# Patient Record
Sex: Female | Born: 1965 | Race: White | Hispanic: No | Marital: Married | Smoking: Never smoker
Health system: Southern US, Community
[De-identification: ages and names within clinical notes are randomized; demographics above are authoritative.]

## PROBLEM LIST (undated history)

## (undated) DIAGNOSIS — E079 Disorder of thyroid, unspecified: Secondary | ICD-10-CM

## (undated) DIAGNOSIS — R569 Unspecified convulsions: Secondary | ICD-10-CM

## (undated) DIAGNOSIS — E785 Hyperlipidemia, unspecified: Secondary | ICD-10-CM

## (undated) DIAGNOSIS — J342 Deviated nasal septum: Secondary | ICD-10-CM

## (undated) DIAGNOSIS — D568 Other thalassemias: Secondary | ICD-10-CM

## (undated) DIAGNOSIS — K219 Gastro-esophageal reflux disease without esophagitis: Secondary | ICD-10-CM

## (undated) DIAGNOSIS — R112 Nausea with vomiting, unspecified: Secondary | ICD-10-CM

## (undated) DIAGNOSIS — T7840XA Allergy, unspecified, initial encounter: Secondary | ICD-10-CM

## (undated) DIAGNOSIS — F419 Anxiety disorder, unspecified: Secondary | ICD-10-CM

## (undated) DIAGNOSIS — Z9889 Other specified postprocedural states: Secondary | ICD-10-CM

## (undated) HISTORY — DX: Allergy, unspecified, initial encounter: T78.40XA

## (undated) HISTORY — DX: Disorder of thyroid, unspecified: E07.9

## (undated) HISTORY — DX: Deviated nasal septum: J34.2

## (undated) HISTORY — DX: Other specified postprocedural states: Z98.890

## (undated) HISTORY — DX: Hyperlipidemia, unspecified: E78.5

## (undated) HISTORY — DX: Gastro-esophageal reflux disease without esophagitis: K21.9

## (undated) HISTORY — DX: Nausea with vomiting, unspecified: R11.2

## (undated) HISTORY — PX: ABDOMINAL HYSTERECTOMY: SHX81

## (undated) HISTORY — DX: Unspecified convulsions: R56.9

## (undated) HISTORY — DX: Other thalassemias: D56.8

## (undated) HISTORY — DX: Anxiety disorder, unspecified: F41.9

## (undated) HISTORY — PX: EXPLORATORY LAPAROTOMY: SUR591

## (undated) HISTORY — PX: SEPTOPLASTY: SUR1290

---

## 1998-11-12 HISTORY — PX: EXPLORATORY LAPAROTOMY: SUR591

## 1999-12-29 ENCOUNTER — Other Ambulatory Visit: Admission: RE | Admit: 1999-12-29 | Discharge: 1999-12-29 | Payer: Self-pay | Admitting: Gynecology

## 2000-04-12 ENCOUNTER — Encounter (INDEPENDENT_AMBULATORY_CARE_PROVIDER_SITE_OTHER): Payer: Self-pay

## 2000-04-12 ENCOUNTER — Other Ambulatory Visit: Admission: RE | Admit: 2000-04-12 | Discharge: 2000-04-12 | Payer: Self-pay | Admitting: Gynecology

## 2000-12-27 ENCOUNTER — Encounter (INDEPENDENT_AMBULATORY_CARE_PROVIDER_SITE_OTHER): Payer: Self-pay | Admitting: Specialist

## 2000-12-27 ENCOUNTER — Other Ambulatory Visit: Admission: RE | Admit: 2000-12-27 | Discharge: 2000-12-27 | Payer: Self-pay | Admitting: Gynecology

## 2001-06-09 ENCOUNTER — Other Ambulatory Visit: Admission: RE | Admit: 2001-06-09 | Discharge: 2001-06-09 | Payer: Self-pay | Admitting: Gynecology

## 2002-01-11 ENCOUNTER — Inpatient Hospital Stay (HOSPITAL_COMMUNITY): Admission: AD | Admit: 2002-01-11 | Discharge: 2002-01-15 | Payer: Self-pay | Admitting: Obstetrics and Gynecology

## 2002-01-11 ENCOUNTER — Encounter (INDEPENDENT_AMBULATORY_CARE_PROVIDER_SITE_OTHER): Payer: Self-pay | Admitting: Specialist

## 2002-02-23 ENCOUNTER — Other Ambulatory Visit: Admission: RE | Admit: 2002-02-23 | Discharge: 2002-02-23 | Payer: Self-pay | Admitting: Obstetrics and Gynecology

## 2003-03-29 ENCOUNTER — Other Ambulatory Visit: Admission: RE | Admit: 2003-03-29 | Discharge: 2003-03-29 | Payer: Self-pay | Admitting: Obstetrics and Gynecology

## 2004-03-29 ENCOUNTER — Other Ambulatory Visit: Admission: RE | Admit: 2004-03-29 | Discharge: 2004-03-29 | Payer: Self-pay | Admitting: Obstetrics and Gynecology

## 2004-10-09 ENCOUNTER — Ambulatory Visit: Payer: Self-pay | Admitting: Internal Medicine

## 2005-04-03 ENCOUNTER — Other Ambulatory Visit: Admission: RE | Admit: 2005-04-03 | Discharge: 2005-04-03 | Payer: Self-pay | Admitting: Obstetrics and Gynecology

## 2005-05-16 ENCOUNTER — Ambulatory Visit: Payer: Self-pay | Admitting: Internal Medicine

## 2005-07-13 ENCOUNTER — Ambulatory Visit: Payer: Self-pay | Admitting: Internal Medicine

## 2005-07-19 ENCOUNTER — Other Ambulatory Visit: Admission: RE | Admit: 2005-07-19 | Discharge: 2005-07-19 | Payer: Self-pay | Admitting: Obstetrics and Gynecology

## 2005-10-15 ENCOUNTER — Emergency Department (HOSPITAL_COMMUNITY): Admission: EM | Admit: 2005-10-15 | Discharge: 2005-10-15 | Payer: Self-pay | Admitting: Emergency Medicine

## 2005-11-14 ENCOUNTER — Ambulatory Visit: Payer: Self-pay | Admitting: Internal Medicine
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
## 2006-05-13 ENCOUNTER — Ambulatory Visit: Payer: Self-pay | Admitting: Internal Medicine

## 2006-06-17 ENCOUNTER — Ambulatory Visit: Payer: Self-pay | Admitting: Internal Medicine

## 2006-12-03 ENCOUNTER — Ambulatory Visit: Payer: Self-pay | Admitting: Internal Medicine

## 2007-01-14 ENCOUNTER — Ambulatory Visit: Payer: Self-pay | Admitting: Internal Medicine

## 2007-04-04 DIAGNOSIS — J309 Allergic rhinitis, unspecified: Secondary | ICD-10-CM | POA: Insufficient documentation

## 2007-04-04 DIAGNOSIS — E785 Hyperlipidemia, unspecified: Secondary | ICD-10-CM

## 2007-06-08 DIAGNOSIS — L732 Hidradenitis suppurativa: Secondary | ICD-10-CM | POA: Insufficient documentation

## 2007-06-09 ENCOUNTER — Telehealth (INDEPENDENT_AMBULATORY_CARE_PROVIDER_SITE_OTHER): Payer: Self-pay | Admitting: *Deleted

## 2007-06-09 ENCOUNTER — Ambulatory Visit: Payer: Self-pay | Admitting: Internal Medicine

## 2007-08-19 ENCOUNTER — Ambulatory Visit: Payer: Self-pay | Admitting: Internal Medicine

## 2008-03-29 ENCOUNTER — Ambulatory Visit: Payer: Self-pay | Admitting: Internal Medicine

## 2008-03-29 ENCOUNTER — Encounter: Payer: Self-pay | Admitting: Internal Medicine

## 2008-03-31 ENCOUNTER — Telehealth: Payer: Self-pay | Admitting: Internal Medicine

## 2008-04-02 ENCOUNTER — Telehealth (INDEPENDENT_AMBULATORY_CARE_PROVIDER_SITE_OTHER): Payer: Self-pay | Admitting: *Deleted

## 2008-08-03 ENCOUNTER — Ambulatory Visit: Payer: Self-pay | Admitting: Internal Medicine

## 2008-08-10 ENCOUNTER — Ambulatory Visit: Payer: Self-pay | Admitting: Internal Medicine

## 2008-08-10 LAB — CONVERTED CEMR LAB
Albumin: 4.2 g/dL (ref 3.5–5.2)
Alkaline Phosphatase: 50 units/L (ref 39–117)
BUN: 13 mg/dL (ref 6–23)
Bilirubin Urine: NEGATIVE
Calcium: 9.4 mg/dL (ref 8.4–10.5)
Cholesterol: 218 mg/dL (ref 0–200)
Creatinine, Ser: 0.8 mg/dL (ref 0.4–1.2)
Direct LDL: 155.8 mg/dL
Eosinophils Absolute: 0.1 10*3/uL (ref 0.0–0.7)
Eosinophils Relative: 1.7 % (ref 0.0–5.0)
GFR calc Af Amer: 102 mL/min
GFR calc non Af Amer: 84 mL/min
Glucose, Bld: 82 mg/dL (ref 70–99)
HCT: 32.8 % — ABNORMAL LOW (ref 36.0–46.0)
Hemoglobin: 10.7 g/dL — ABNORMAL LOW (ref 12.0–15.0)
Ketones, urine, test strip: NEGATIVE
MCV: 58.8 fL — ABNORMAL LOW (ref 78.0–100.0)
Monocytes Absolute: 0.3 10*3/uL (ref 0.1–1.0)
Neutro Abs: 2.3 10*3/uL (ref 1.4–7.7)
Platelets: 348 10*3/uL (ref 150–400)
Potassium: 5 meq/L (ref 3.5–5.1)
RDW: 16.1 % — ABNORMAL HIGH (ref 11.5–14.6)
Specific Gravity, Urine: 1.02
TSH: 3.09 microintl units/mL (ref 0.35–5.50)
Total Protein: 6.6 g/dL (ref 6.0–8.3)
Triglycerides: 79 mg/dL (ref 0–149)
WBC: 3.8 10*3/uL — ABNORMAL LOW (ref 4.5–10.5)

## 2008-08-15 ENCOUNTER — Encounter: Payer: Self-pay | Admitting: Internal Medicine

## 2008-08-17 ENCOUNTER — Ambulatory Visit: Payer: Self-pay | Admitting: Internal Medicine

## 2008-08-17 ENCOUNTER — Encounter: Payer: Self-pay | Admitting: Internal Medicine

## 2008-08-17 DIAGNOSIS — D568 Other thalassemias: Secondary | ICD-10-CM | POA: Insufficient documentation

## 2008-08-17 HISTORY — DX: Other thalassemias: D56.8

## 2008-08-17 LAB — CONVERTED CEMR LAB
Iron: 97 ug/dL (ref 42–145)
Transferrin: 200.4 mg/dL — ABNORMAL LOW (ref >=212.0)

## 2008-09-09 ENCOUNTER — Ambulatory Visit: Payer: Self-pay | Admitting: Internal Medicine

## 2008-09-09 ENCOUNTER — Encounter: Payer: Self-pay | Admitting: Internal Medicine

## 2008-11-12 HISTORY — PX: ABDOMINAL HYSTERECTOMY: SHX81

## 2008-12-23 ENCOUNTER — Ambulatory Visit: Payer: Self-pay | Admitting: Internal Medicine

## 2008-12-23 LAB — CONVERTED CEMR LAB
ALT: 13 units/L (ref 0–35)
Alkaline Phosphatase: 51 units/L (ref 39–117)
Bilirubin, Direct: 0.1 mg/dL (ref 0.0–0.3)
Cholesterol: 234 mg/dL (ref 0–200)
Eosinophils Relative: 1.5 % (ref 0.0–5.0)
HCT: 35.3 % — ABNORMAL LOW (ref 36.0–46.0)
HDL: 67.3 mg/dL (ref >39.0)
Monocytes Absolute: 0.3 10*3/uL (ref 0.1–1.0)
Monocytes Relative: 6.6 % (ref 3.0–12.0)
Neutrophils Relative %: 64.8 % (ref 43.0–77.0)
Platelets: 337 10*3/uL (ref 150–400)
RDW: 16.6 % — ABNORMAL HIGH (ref 11.5–14.6)
Total Bilirubin: 0.9 mg/dL (ref 0.3–1.2)
Total CHOL/HDL Ratio: 3.5
Total Protein: 6.8 g/dL (ref 6.0–8.3)
Triglycerides: 103 mg/dL (ref 0–149)
VLDL: 21 mg/dL (ref 0–40)
WBC: 4.5 10*3/uL (ref 4.5–10.5)

## 2009-01-04 ENCOUNTER — Ambulatory Visit: Payer: Self-pay | Admitting: Internal Medicine

## 2009-01-04 DIAGNOSIS — B070 Plantar wart: Secondary | ICD-10-CM

## 2009-01-04 DIAGNOSIS — J40 Bronchitis, not specified as acute or chronic: Secondary | ICD-10-CM

## 2009-01-04 LAB — CONVERTED CEMR LAB
Cholesterol, target level: 200 mg/dL
LDL Goal: 160 mg/dL

## 2009-01-07 ENCOUNTER — Ambulatory Visit: Payer: Self-pay | Admitting: Internal Medicine

## 2009-04-28 ENCOUNTER — Ambulatory Visit: Payer: Self-pay | Admitting: Internal Medicine

## 2009-04-28 DIAGNOSIS — T887XXA Unspecified adverse effect of drug or medicament, initial encounter: Secondary | ICD-10-CM | POA: Insufficient documentation

## 2009-04-28 LAB — CONVERTED CEMR LAB
ALT: 12 units/L (ref 0–35)
Alkaline Phosphatase: 43 units/L (ref 39–117)
Bilirubin, Direct: 0 mg/dL (ref 0.0–0.3)
Direct LDL: 139.4 mg/dL
HDL: 71.4 mg/dL (ref >39.00)
Total Bilirubin: 0.9 mg/dL (ref 0.3–1.2)
Total CHOL/HDL Ratio: 3
Total Protein: 7.1 g/dL (ref 6.0–8.3)
Triglycerides: 91 mg/dL (ref 0.0–149.0)

## 2009-05-05 ENCOUNTER — Ambulatory Visit: Payer: Self-pay | Admitting: Internal Medicine

## 2009-05-05 DIAGNOSIS — L910 Hypertrophic scar: Secondary | ICD-10-CM

## 2009-06-11 LAB — HM MAMMOGRAPHY

## 2009-08-25 ENCOUNTER — Ambulatory Visit: Payer: Self-pay | Admitting: Internal Medicine

## 2009-12-14 ENCOUNTER — Ambulatory Visit: Payer: Self-pay | Admitting: Internal Medicine

## 2009-12-14 LAB — CONVERTED CEMR LAB
AST: 22 units/L (ref 0–37)
Albumin: 4.3 g/dL (ref 3.5–5.2)
BUN: 11 mg/dL (ref 6–23)
Basophils Absolute: 0 10*3/uL (ref 0.0–0.1)
CO2: 29 meq/L (ref 19–32)
Chloride: 108 meq/L (ref 96–112)
Direct LDL: 143.6 mg/dL
GFR calc non Af Amer: 83.15 mL/min (ref >60)
Glucose, Bld: 86 mg/dL (ref 70–99)
Glucose, Urine, Semiquant: NEGATIVE
HCT: 35.1 % — ABNORMAL LOW (ref 36.0–46.0)
Hemoglobin: 11.1 g/dL — ABNORMAL LOW (ref 12.0–15.0)
Lymphs Abs: 1 10*3/uL (ref 0.7–4.0)
MCHC: 31.5 g/dL (ref 30.0–36.0)
MCV: 61.3 fL — ABNORMAL LOW (ref 78.0–100.0)
Monocytes Absolute: 0.2 10*3/uL (ref 0.1–1.0)
Monocytes Relative: 6.4 % (ref 3.0–12.0)
Neutro Abs: 2.6 10*3/uL (ref 1.4–7.7)
Nitrite: NEGATIVE
Potassium: 4.3 meq/L (ref 3.5–5.1)
RDW: 15.8 % — ABNORMAL HIGH (ref 11.5–14.6)
Sodium: 141 meq/L (ref 135–145)
Specific Gravity, Urine: 1.025
TSH: 2.74 microintl units/mL (ref 0.35–5.50)
WBC Urine, dipstick: NEGATIVE
pH: 5.5

## 2010-01-06 ENCOUNTER — Ambulatory Visit: Payer: Self-pay | Admitting: Internal Medicine

## 2010-01-16 LAB — CONVERTED CEMR LAB: Iron: 70 ug/dL (ref 42–145)

## 2010-02-10 ENCOUNTER — Ambulatory Visit: Payer: Self-pay | Admitting: Internal Medicine

## 2010-06-19 ENCOUNTER — Ambulatory Visit: Payer: Self-pay | Admitting: Internal Medicine

## 2010-12-12 NOTE — Assessment & Plan Note (Signed)
Summary: cpx/no pap/njr rsc appt time/njr   Vital Signs:  Patient profile:   45 year old female Height:      64 inches Weight:      122 pounds BMI:     21.02 Temp:     98.2 degrees F oral Pulse rate:   72 / minute Resp:     14 per minute BP sitting:   120 / 70  (left arm)  Vitals Entered By: Willy Eddy, LPN (January 06, 2010 10:47 AM) CC: cpx   CC:  cpx.  History of Present Illness: The pt was asked about all immunizations, health maint. 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  Preventive Screening-Counseling & Management  Alcohol-Tobacco     Smoking Status: never  Problems Prior to Update: 1)  Keloid Scar  (ICD-701.4) 2)  Abscess, Tooth  (ICD-522.5) 3)  Uns Advrs Eff Uns Rx Medicinal&biological Sbstnc  (ICD-995.20) 4)  Plantar Wart  (ICD-078.12) 5)  Bronchitis  (ICD-490) 6)  Preventive Health Care  (ICD-V70.0) 7)  Other Thalassemia  (ICD-282.49) 8)  Physical Examination  (ICD-V70.0) 9)  Neop, Malignant, Skin, Trunk  (ICD-173.5) 10)  Hidradenitis  (ICD-705.83) 11)  Family History Diabetes 1st Degree Relative  (ICD-V18.0) 12)  Hyperlipidemia  (ICD-272.4) 13)  Allergic Rhinitis  (ICD-477.9)  Medications Prior to Update: 1)  Omega-3 350 Mg Caps (Omega-3 Fatty Acids) .Marland Kitchen.. 1 Once Daily 2)  Zithromax Z-Pak 250 Mg  Tabs (Azithromycin) .... Two By Mouth Now Then One By Mouth Daily For 4 Days 3)  Atuss Ds 30-4-30 Mg/52ml Susp (Pseudoephed Hcl-Cpm-Dm Hbr Tan) .... Two Tsp By Mouth Q 12 Hours 4)  Augmentin Xr 1000-62.5 Mg Xr12h-Tab (Amoxicillin-Pot Clavulanate) .... Two By Mouth Two Times A Day With Food 5)  Altabax 1 % Oint (Retapamulin) .... Apply To Would As Directed  Current Medications (verified): 1)  Omega-3 350 Mg Caps (Omega-3 Fatty Acids) .Marland Kitchen.. 1 Once Daily 2)  Altabax 1 % Oint (Retapamulin) .... Apply To Would As  Directed  Allergies (verified): No Known Drug Allergies  Past History:  Family History: Last updated: 06/09/2007 father Family History Diabetes 1st degree relative mother with MS  Social History: Last updated: 06/09/2007 Occupation:teacher Married Never Smoked  Risk Factors: Smoking Status: never (01/06/2010)  Past medical, surgical, family and social histories (including risk factors) reviewed, and no changes noted (except as noted below).  Past Medical History: Reviewed history from 01/04/2009 and no changes required. deviated septum Allergic rhinitis Hyperlipidemia premalignant t lesions on skin thalasemia trait  Past Surgical History: Reviewed history from 04/04/2007 and no changes required. septoplasty2007  Family History: Reviewed history from 06/09/2007 and no changes required. father Family History Diabetes 1st degree relative mother with MS  Social History: Reviewed history from 06/09/2007 and no changes required. Occupation:teacher Married Never Smoked  Review of Systems  The patient denies anorexia, fever, weight loss, weight gain, vision loss, decreased hearing, hoarseness, chest pain, syncope, dyspnea on exertion, peripheral edema, prolonged cough, headaches, hemoptysis, abdominal pain, melena, hematochezia, severe indigestion/heartburn, hematuria, incontinence, genital sores, muscle weakness, suspicious skin lesions, transient blindness, difficulty walking, depression, unusual weight change, abnormal bleeding, enlarged lymph nodes, angioedema, and breast masses.    Physical Exam  General:  Well-developed,well-nourished,in no acute distress; alert,appropriate and cooperative throughout examination Head:  Normocephalic and atraumatic without obvious abnormalities. No apparent alopecia or balding. Eyes:  pupils equal, pupils round, and pupils reactive to light.   Nose:  External nasal examination shows no deformity or inflammation. Nasal mucosa are  pink and moist without lesions or exudates. Mouth:  tender swollen jat with palpable mass Neck:  No deformities, masses, or tenderness noted. Lungs:  Normal respiratory effort, chest expands symmetrically. increased bronchial BS with slight end whheeze Heart:  Normal rate and regular rhythm. S1 and S2 normal without gallop, murmur, click, rub or other extra sounds. Abdomen:  Bowel sounds positive,abdomen soft and non-tender without masses, organomegaly or hernias noted. Msk:  No deformity or scoliosis noted of thoracic or lumbar spine.   Pulses:  R and L carotid,radial,femoral,dorsalis pedis and posterior tibial pulses are full and equal bilaterally Extremities:  No clubbing, cyanosis, edema, or deformity noted with normal full range of motion of all joints.   Neurologic:  No cranial nerve deficits noted. Station and gait are normal. Plantar reflexes are down-going bilaterally. DTRs are symmetrical throughout. Sensory, motor and coordinative functions appear intact.   Impression & Recommendations:  Problem # 1:  PREVENTIVE HEALTH CARE (ICD-V70.0) The pt was asked about all immunizations, health maint. services that are appropriate to their age and was given guidance on diet exercize  and weight management  Mammogram: normal (06/11/2009) Pap smear: normal (06/11/2009) Td Booster: Tdap (08/17/2008)   Flu Vax: Fluvax 3+ (08/25/2009)   Chol: 225 (12/14/2009)   HDL: 70.50 (12/14/2009)   LDL: DEL (12/23/2008)   TG: 114.0 (12/14/2009) TSH: 2.74 (12/14/2009)   Next mammogram due:: 06/2010 (01/06/2010)  Discussed using sunscreen, use of alcohol, drug use, self breast exam, routine dental care, routine eye care, schedule for GYN exam, routine physical exam, seat belts, multiple vitamins, osteoporosis prevention, adequate calcium intake in diet, recommendations for immunizations, mammograms and Pap smears.  Discussed exercise and checking cholesterol.  Discussed gun safety, safe sex, and  contraception.  Problem # 2:  OTHER THALASSEMIA (ICD-282.49)  stable hgb  Orders: Venipuncture (16109) TLB-IBC Pnl (Iron/FE;Transferrin) (83550-IBC)  Problem # 3:  ALLERGIC RHINITIS (ICD-477.9)  Discussed use of allergy medications and environmental measures.   Complete Medication List: 1)  Omega-3 350 Mg Caps (Omega-3 fatty acids) .Marland Kitchen.. 1 once daily 2)  Altabax 1 % Oint (Retapamulin) .... Apply to would as directed  Patient Instructions: 1)  Please schedule a follow-up appointment in 2 months. for keloid  removal 30 min   Preventive Care Screening  Mammogram:    Date:  06/11/2009    Next Due:  06/2010    Results:  normal   Pap Smear:    Date:  06/11/2009    Next Due:  06/2010    Results:  normal

## 2010-12-12 NOTE — Assessment & Plan Note (Signed)
Summary: keloid removal/njr   Vital Signs:  Patient profile:   45 year old female Height:      64 inches Weight:      122 pounds BMI:     21.02 Temp:     98.6 degrees F oral Pulse rate:   72 / minute Resp:     14 per minute BP sitting:   120 / 70  (left arm)  Vitals Entered By: Willy Eddy, LPN (June 19, 2010 8:48 AM) CC: keloid removal Is Patient Diabetic? No   CC:  keloid removal.  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 1 Once Daily 2)  Altabax 1 % Oint (Retapamulin) .... Apply To Would As Directed  Allergies (verified): No Known Drug Allergies   Impression & Recommendations:  Problem # 1:  KELOID SCAR (ICD-701.4) Assessment Deteriorated  removal of 3 cm kelpoid that was irritated informed consent obtained local anesthesia  with 2% with epi 4 cm elipical incision that was closed with 7 60 sutures and sterile dressing placed wound care discussed and an rx for bactroban cream given appoint for suture removal specimine sent for path pt tolerated procedure will  Orders: No Charge Patient Arrived (NCPA0) (NCPA0) Surgical Suture Tray (J4782) Excise lesion (TAL) 1.1 - 2.0 cm (11402)  Complete Medication List: 1)  Omega-3 350 Mg Caps (Omega-3 fatty acids) .Marland Kitchen.. 1 once  daily 2)  Mupirocin 2 % Oint (Mupirocin) .... Apply to site two times a day  Patient Instructions: 1)  suture removal next week 2)  keep dressing in place for today 3)  use bandaide coverage to keep dry Prescriptions: MUPIROCIN 2 % OINT (MUPIROCIN) apply to site two times a day  #15 gm x 1   Entered and Authorized by:   Stacie Glaze MD   Signed by:   Stacie Glaze MD on 06/19/2010   Method used:   Electronically to        [phone] (retail)       [address] [place], [place]  [address]       Ph: [phone] or [phone]       Fax: [phone]   RxID:   [id number]

## 2010-12-12 NOTE — Assessment & Plan Note (Signed)
Summary: leloid removal/bmw   Vital Signs:  Patient profile:   45 year old female Height:      64 inches Weight:      122 pounds BMI:     21.02 Temp:     98.6 degrees F oral Pulse rate:   72 / minute Resp:     12 per minute BP sitting:   120 / 70  (left arm)  Vitals Entered By: Willy Eddy, LPN (February 10, 1609 8:30 AM) CC: keloid removal   CC:  keloid removal.  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 1 Once Daily 2)  Altabax 1 % Oint (Retapamulin) .... Apply To Would As Directed  Allergies (verified): No Known Drug Allergies  Physical Exam  Skin:  2cm kelioid in left axilla   Impression & Recommendations:  Problem # 1:  KELOID SCAR (ICD-701.4) site was prepped in a aterile manner a 3 cm elipitical excission was made to remove the leson and closed with 6-0 sutures ( 7) care instructions given specimine sent for path Orders: No Charge Patient Arrived (NCPA0) (NCPA0) Excise lesion (TAL) 1.1 - 2.0 cm (11402) Surgical Suture Tray (R6045)  Complete Medication List: 1)  Omega-3 350 Mg Caps (Omega-3 fatty acids) .Marland Kitchen.. 1 once daily 2)  Altabax 1 % Oint (Retapamulin) .... Apply to would as directed  Patient  Instructions: 1)  Please schedule a follow-up appointment in 3 months.30 min remove second keloid

## 2011-03-30 NOTE — Op Note (Signed)
[facility]  Patient:    Melissa Schultz, Melissa Schultz Visit Number: [id number] MRN: [id number]          Service Type: OBS Location: 910A 9105 01 Attending Physician:  Trevor Iha Dictated by:   Trevor Iha, M.D. Proc. Date: 01/12/02 Admit Date:  01/11/2002                             Operative Report  PREOPERATIVE DIAGNOSES:       1. Intrauterine pregnancy at 39 weeks.                               2. Questionable herpes simplex virus.                               3. Fetal intrauterine growth restriction.  POSTOPERATIVE DIAGNOSES:      1. Intrauterine pregnancy at 39 weeks.                               2. Questionable herpes simplex virus.                               3. Fetal intrauterine growth restriction.  OPERATION:                    Primary low segment transverse cesarean section.  SURGEON:                      Trevor Iha, M.D.  ANESTHESIA:                   Spinal.  ESTIMATED BLOOD LOSS:         800 cc.  INDICATIONS:                  The patient is a 45 year old G3, P0, A1, who was admitted last night for induction of labor due to IUGR.  Her husband has a history of HSV.  She has tingling and burning in the labia, but no obvious lesions.  Currently on Valtrex prophylactically because of a questionable early outbreak of HSV with a partner that is positive for HSV.  Decided to proceed with primary low segment transverse cesarean section.  The risks and benefits were discussed at length and informed consent was obtained.  FINDINGS AT TIME OF SURGERY:  A viable female infant.  Apgars were 7 and 9, pH was 7.21 arterial.  DESCRIPTION OF PROCEDURE:     After after analgesia, the patient was placed in the supine position with a left lateral tilt.  She was sterilely prepped and draped.  The bladder was sterilely drained with a Foley catheter.  Previous Pfannenstiel skin incision was sharply removed.  The incision was taken  down sharply to the fascia which was incised transversely, extended superiorly and inferiorly off the bellies rectus muscle, and separated sharply in the midline.  The peritoneum was entered sharply.  A bladder blade was placed. The uterine serosa was elevated, nicked, and incised transversely.  A bladder flap was created and replaced behind the bladder blade.  A low simple myotomy incision was made down to the infants vertex and it was extended laterally with the  operators fingertips.  Amniotomy was performed with clear fluid. The infants vertex was delivered through the myotomy incision.  Nares and pharynx were suctioned and descent delivered.  The cord was clamped and handed to the pediatricians.  Cord blood was then obtained and the placenta extracted manually.  The uterus was exteriorized and wiped clean with a dry lap.  The line of the incision was closed in two layers, the first being a running locking layer and the second being an imbricating layer of 0 Monocryl.  Normal tubes and ovaries were noted.  The uterus was placed back into the peritoneal cavity and after a copious amount of irrigation and adequate hemostasis was assured, the peritoneum was closed with a single layer of 0 Monocryl. Irrigation was applied and after adequate hemostasis, the fascia was closed with a single layer of #1 Vicryl.  Irrigation was once again applied, and after adequate hemostasis, skin staples and Steri-Strips applied.  The patient tolerated the procedure well and was stable on transfer to the recovery room.  Sponge and instrument count was correct x3.  The patient received 1 g of cefotetan at delivery of the placenta.  The placenta was sent to pathology. Dictated by:   Trevor Iha, M.D. Attending Physician:  Trevor Iha DD:  01/12/02 TD:  01/13/02 Job: [id number]

## 2011-03-30 NOTE — Discharge Summary (Signed)
[facility]  Patient:    Melissa Schultz, Melissa Schultz Visit Number: [id number] MRN: [id number]          Service Type: OBS Location: 910A 9105 01 Attending Physician:  Trevor Iha Dictated by:   Danie Chandler, R.N. Admit Date:  01/11/2002 Discharge Date: 01/15/2002                             Discharge Summary  ADMISSION DIAGNOSES: 1. Intrauterine pregnancy at [redacted] weeks gestation. 2. Questionable herpes simplex virus. 3. Fetal intrauterine growth restriction.  DISCHARGE DIAGNOSES: 1. Intrauterine pregnancy at [redacted] weeks gestation. 2. Questionable herpes simplex virus. 3. Fetal intrauterine growth restriction.  PROCEDURE:  On January 12, 2002, primary low segment transverse cesarean section.  REASON FOR ADMISSION:  The patient is a 45 year old, gravida 3, para 0, who was admitted on the night of January 11, 2002, for induction of labor due to IUGR.  The patients husband has a history of HSV.  The patient was complaining of some tingling and burning in the labia, but no obvious lesions. The patient was on Valtrex prophylactically, but because of a questionable early outbreak of HSV with a partner that is positive for HSV, the decision was made to proceed with a primary low segment transverse cesarean section.  HOSPITAL COURSE:  The patient was taken to the operating room, and underwent the above named procedure without complication.  This was productive of a viable female infant with Apgars of 7 at one minute and 9 at five minutes, and an arterial cord pH of 7.21.  Postoperatively, on day #1, the patient had good control of pain.  Her hemoglobin was 8.7, hematocrit 26.4, and white blood cell count 11.0.  On postoperative day #2, the patient was without complaints. She was tolerating a regular diet, and had a good return of bowel function. She was ambulating well without difficulty, and she was discharged home on postoperative day #3.  CONDITION ON  DISCHARGE:  Good.  DIET:  Regular as tolerated.  ACTIVITY:  No heavy lifting, no driving, no vaginal entry.  FOLLOWUP:  In the office in 1 to 2 weeks for an incision check.  She is to call for temperature greater than 100 degrees, persistent nausea or vomiting, heavy vaginal bleeding, and/or redness or drainage from the incision site.  DISCHARGE MEDICATIONS: 1. Prenatal vitamin one p.o. q.d. 2. Pain medications as directed by M.D. Dictated by:   Danie Chandler, R.N. Attending Physician:  Trevor Iha DD:  01/15/02 TD:  01/16/02 Job: [id number]

## 2011-09-04 ENCOUNTER — Other Ambulatory Visit (INDEPENDENT_AMBULATORY_CARE_PROVIDER_SITE_OTHER): Payer: 59

## 2011-09-04 DIAGNOSIS — Z0000 Encounter for general adult medical examination without abnormal findings: Secondary | ICD-10-CM

## 2011-09-04 LAB — CBC WITH DIFFERENTIAL/PLATELET
Basophils Absolute: 0 10*3/uL (ref 0.0–0.1)
HCT: 35.2 % — ABNORMAL LOW (ref 36.0–46.0)
Lymphs Abs: 1.1 10*3/uL (ref 0.7–4.0)
MCV: 59.7 fl — ABNORMAL LOW (ref 78.0–100.0)
Monocytes Absolute: 0.3 10*3/uL (ref 0.1–1.0)
Platelets: 322 10*3/uL (ref 150.0–400.0)
RDW: 17.2 % — ABNORMAL HIGH (ref 11.5–14.6)

## 2011-09-04 LAB — POCT URINALYSIS DIPSTICK
Bilirubin, UA: NEGATIVE
Blood, UA: NEGATIVE
Nitrite, UA: NEGATIVE
Urobilinogen, UA: 0.2
pH, UA: 5.5

## 2011-09-04 LAB — LIPID PANEL
Cholesterol: 229 mg/dL — ABNORMAL HIGH (ref 0–200)
Total CHOL/HDL Ratio: 3
Triglycerides: 109 mg/dL (ref 0.0–149.0)
VLDL: 21.8 mg/dL (ref 0.0–40.0)

## 2011-09-04 LAB — HEPATIC FUNCTION PANEL: Total Bilirubin: 0.6 mg/dL (ref 0.3–1.2)

## 2011-09-04 LAB — BASIC METABOLIC PANEL
BUN: 16 mg/dL (ref 6–23)
Chloride: 107 mEq/L (ref 96–112)
GFR: 103 mL/min (ref >60.00)
Glucose, Bld: 87 mg/dL (ref 70–99)
Potassium: 4.7 mEq/L (ref 3.5–5.1)

## 2011-09-19 ENCOUNTER — Ambulatory Visit (INDEPENDENT_AMBULATORY_CARE_PROVIDER_SITE_OTHER): Payer: 59 | Admitting: Internal Medicine

## 2011-09-19 ENCOUNTER — Encounter: Payer: Self-pay | Admitting: Internal Medicine

## 2011-09-19 VITALS — BP 110/78 | HR 72 | Temp 98.2°F | Resp 16 | Ht 61.5 in | Wt 118.0 lb

## 2011-09-19 DIAGNOSIS — D568 Other thalassemias: Secondary | ICD-10-CM

## 2011-09-19 DIAGNOSIS — Z0000 Encounter for general adult medical examination without abnormal findings: Secondary | ICD-10-CM

## 2011-09-19 DIAGNOSIS — T887XXA Unspecified adverse effect of drug or medicament, initial encounter: Secondary | ICD-10-CM

## 2011-09-19 DIAGNOSIS — E785 Hyperlipidemia, unspecified: Secondary | ICD-10-CM

## 2011-09-19 DIAGNOSIS — Z23 Encounter for immunization: Secondary | ICD-10-CM

## 2011-09-19 DIAGNOSIS — K219 Gastro-esophageal reflux disease without esophagitis: Secondary | ICD-10-CM | POA: Insufficient documentation

## 2011-09-19 NOTE — Progress Notes (Signed)
Subjective:    Patient ID: Melissa Schultz, female    DOB: 1966/06/19, 45 y.o.   MRN: [id number]  HPI Presents for complete physical examination.  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  Denies any dark stools has had no vomiting nausea or no abdominal pain associated with the symptoms   Review of Systems  Constitutional: Negative for activity change, appetite change and fatigue.  HENT: Negative for ear pain, congestion, neck pain, postnasal drip and sinus pressure.   Eyes: Negative for redness and visual disturbance.  Respiratory: Negative for cough, shortness of breath and wheezing.   Gastrointestinal: Positive for abdominal distention. Negative for abdominal pain.  Genitourinary: Negative for dysuria, frequency and menstrual problem.  Musculoskeletal: Negative for myalgias, joint swelling and arthralgias.  Skin: Negative for rash and wound.  Neurological: Negative for dizziness, weakness and headaches.  Hematological: Negative for adenopathy. Does not bruise/bleed easily.  Psychiatric/Behavioral: Negative for sleep disturbance and decreased concentration.   Past Medical History  Diagnosis Date  . Deviated septum   . Allergy   . Hyperlipidemia   . Other thalassemia 08/17/2008   Past Surgical History  Procedure Date  . Septoplasty     reports that she has never smoked. She does not have any smokeless tobacco history on file. Her alcohol and drug histories not on file. family history includes Diabetes in her father and Multiple sclerosis in her mother. No Known Allergies      Objective:   Physical Exam  Constitutional: She is oriented to person, place, and time. She appears well-developed and well-nourished. No distress.  HENT:  Head: Normocephalic and atraumatic.  Right  Ear: External ear normal.  Left Ear: External ear normal.  Nose: Nose normal.  Mouth/Throat: Oropharynx is clear and moist.  Eyes: Conjunctivae and EOM are normal. Pupils are equal, round, and reactive to light.  Neck: Normal range of motion. Neck supple. No JVD present. No tracheal deviation present. No thyromegaly present.  Cardiovascular: Normal rate, regular rhythm, normal heart sounds and intact distal pulses.   No murmur heard. Pulmonary/Chest: Effort normal and breath sounds normal. She has no wheezes. She exhibits no tenderness.  Abdominal: Soft. Bowel sounds are normal.  Musculoskeletal: Normal range of motion. She exhibits no edema and no tenderness.  Lymphadenopathy:    She has no cervical adenopathy.  Neurological: She is alert and oriented to person, place, and time. She has normal reflexes. No cranial nerve deficit.  Skin: Skin is warm and dry. She is not diaphoretic.  Psychiatric: She has a normal mood and affect. Her behavior is normal.          Assessment & Plan:   This is a routine physical examination for this healthy  Female. Reviewed all health maintenance protocols including mammography colonoscopy bone density and reviewed appropriate screening labs. Her immunization history was reviewed as well as her current medications and allergies refills of her chronic medications were given and the plan for yearly health maintenance was discussed all orders and referrals were made as appropriate. Patient's thalassemia stable.  Patient has worsening GERD symptomology we discussed the use of a probiotic because of the bloating she experiences after eating and we have asked her to monitor her use of Pepcid if indeed she is using Pepcid on a regular basis there  may be some consideration for an EGD to rule out Barrett's esophagus and an H. pylori to look for bacterial gastritis.

## 2011-09-19 NOTE — Patient Instructions (Signed)
The patient is instructed to continue all medications as prescribed. Schedule followup with check out clerk upon leaving the clinic  

## 2011-11-13 HISTORY — PX: SEPTOPLASTY: SUR1290

## 2012-09-02 ENCOUNTER — Ambulatory Visit: Payer: 59

## 2012-09-04 ENCOUNTER — Ambulatory Visit: Payer: 59

## 2012-09-08 ENCOUNTER — Ambulatory Visit: Payer: 59 | Admitting: Internal Medicine

## 2012-09-17 ENCOUNTER — Other Ambulatory Visit: Payer: Self-pay | Admitting: Obstetrics and Gynecology

## 2013-03-24 DIAGNOSIS — M85679 Other cyst of bone, unspecified ankle and foot: Secondary | ICD-10-CM | POA: Insufficient documentation

## 2013-06-17 ENCOUNTER — Telehealth: Payer: Self-pay | Admitting: Internal Medicine

## 2013-06-17 NOTE — Telephone Encounter (Signed)
Patient Information:  Caller Name: Shon Hale  Phone: [phone]  Patient: Melissa Schultz, Melissa Schultz  Gender: Female  DOB: 08-06-1966  Age: 46 Years  PCP: Darryll Capers (Adults only)  Pregnant: No  Office Follow Up:  Does the office need to follow up with this patient?: No  Instructions For The Office: N/A  RN Note:  Pt has had intermittent Face and Arm tingling for 1 month.  States her Mother has MS, Pt concerned.  All emergent symptoms ruled out per Neuro deficits protocol, see in 24 hrs due to single episode of neuro deficit symptoms occurred more than 24 hrs ago. Pt was advised Dr Marlyne Beards has no appt until December.  Appt offered w/ Kathlynn Grate, PA on 8-7, Pt unable to make due to work schedule, Pt requesting appt on 8-14, Pt transferred to office to schedule.   Symptoms  Reason For Call & Symptoms: Intermittently Face and Forearms tingling  Reviewed Health History In EMR: Yes  Reviewed Medications In EMR: Yes  Reviewed Allergies In EMR: Yes  Reviewed Surgeries / Procedures: Yes  Date of Onset of Symptoms: 05/17/2013 OB / GYN:  LMP: Unknown  Guideline(s) Used:  No Protocol Available - Sick Adult  Disposition Per Guideline:   See Today or Tomorrow in Office  Reason For Disposition Reached:   Nursing judgment  Advice Given:  N/A  Patient Will Follow Care Advice:  YES

## 2013-06-25 ENCOUNTER — Encounter: Payer: Self-pay | Admitting: Family

## 2013-06-25 ENCOUNTER — Ambulatory Visit (INDEPENDENT_AMBULATORY_CARE_PROVIDER_SITE_OTHER): Payer: 59 | Admitting: Family

## 2013-06-25 VITALS — BP 102/62 | HR 70 | Wt 121.0 lb

## 2013-06-25 DIAGNOSIS — R209 Unspecified disturbances of skin sensation: Secondary | ICD-10-CM

## 2013-06-25 DIAGNOSIS — R202 Paresthesia of skin: Secondary | ICD-10-CM

## 2013-06-25 DIAGNOSIS — R200 Anesthesia of skin: Secondary | ICD-10-CM

## 2013-06-25 LAB — COMPREHENSIVE METABOLIC PANEL
Albumin: 4.2 g/dL (ref 3.5–5.2)
CO2: 25 mEq/L (ref 19–32)
Calcium: 9.1 mg/dL (ref 8.4–10.5)
Chloride: 107 mEq/L (ref 96–112)
GFR: 84.26 mL/min (ref >60.00)
Glucose, Bld: 84 mg/dL (ref 70–99)
Potassium: 4.4 mEq/L (ref 3.5–5.1)
Sodium: 139 mEq/L (ref 135–145)
Total Bilirubin: 0.8 mg/dL (ref 0.3–1.2)
Total Protein: 7 g/dL (ref 6.0–8.3)

## 2013-06-25 LAB — CBC WITH DIFFERENTIAL/PLATELET
Basophils Relative: 0.3 % (ref 0.0–3.0)
Eosinophils Absolute: 0 10*3/uL (ref 0.0–0.7)
Eosinophils Relative: 0.8 % (ref 0.0–5.0)
Hemoglobin: 11.4 g/dL — ABNORMAL LOW (ref 12.0–15.0)
Lymphocytes Relative: 25.7 % (ref 12.0–46.0)
MCHC: 32.5 g/dL (ref 30.0–36.0)
Neutro Abs: 3.2 10*3/uL (ref 1.4–7.7)
RBC: 6.07 Mil/uL — ABNORMAL HIGH (ref 3.87–5.11)

## 2013-06-25 LAB — LIPID PANEL: Cholesterol: 235 mg/dL — ABNORMAL HIGH (ref 0–200)

## 2013-06-25 NOTE — Patient Instructions (Signed)
Stress Stress-related medical problems are becoming increasingly common. The body has a built-in physical response to stressful situations. Faced with pressure, challenge or danger, we need to react quickly. Our bodies release hormones such as cortisol and adrenaline to help do this. These hormones are part of the "fight or flight" response and affect the metabolic rate, heart rate and blood pressure, resulting in a heightened, stressed state that prepares the body for optimum performance in dealing with a stressful situation. It is likely that early man required these mechanisms to stay alive, but usually modern stresses do not call for this, and the same hormones released in today's world can damage health and reduce coping ability. CAUSES  Pressure to perform at work, at school or in sports.  Threats of physical violence.  Money worries.  Arguments.  Family conflicts.  Divorce or separation from significant other.  Bereavement.  New job or unemployment.  Changes in location.  Alcohol or drug abuse. SOMETIMES, THERE IS NO PARTICULAR REASON FOR DEVELOPING STRESS. Almost all people are at risk of being stressed at some time in their lives. It is important to know that some stress is temporary and some is long term.  Temporary stress will go away when a situation is resolved. Most people can cope with short periods of stress, and it can often be relieved by relaxing, taking a walk, chatting through issues with friends, or having a good night's sleep.  Chronic (long-term, continuous) stress is much harder to deal with. It can be psychologically and emotionally damaging. It can be harmful both for an individual and for friends and family. SYMPTOMS Everyone reacts to stress differently. There are some common effects that help us recognize it. In times of extreme stress, people may:  Shake uncontrollably.  Breathe faster and deeper than normal (hyperventilate).  Vomit.  For people  with asthma, stress can trigger an attack.  For some people, stress may trigger migraine headaches, ulcers, and body pain. PHYSICAL EFFECTS OF STRESS MAY INCLUDE:  Loss of energy.  Skin problems.  Aches and pains resulting from tense muscles, including neck ache, backache and tension headaches.  Increased pain from arthritis and other conditions.  Irregular heart beat (palpitations).  Periods of irritability or anger.  Apathy or depression.  Anxiety (feeling uptight or worrying).  Unusual behavior.  Loss of appetite.  Comfort eating.  Lack of concentration.  Loss of, or decreased, sex-drive.  Increased smoking, drinking, or recreational drug use.  For women, missed periods.  Ulcers, joint pain, and muscle pain. Post-traumatic stress is the stress caused by any serious accident, strong emotional damage, or extremely difficult or violent experience such as rape or war. Post-traumatic stress victims can experience mixtures of emotions such as fear, shame, depression, guilt or anger. It may include recurrent memories or images that may be haunting. These feelings can last for weeks, months or even years after the traumatic event that triggered them. Specialized treatment, possibly with medicines and psychological therapies, is available. If stress is causing physical symptoms, severe distress or making it difficult for you to function as normal, it is worth seeing your caregiver. It is important to remember that although stress is a usual part of life, extreme or prolonged stress can lead to other illnesses that will need treatment. It is better to visit a doctor sooner rather than later. Stress has been linked to the development of high blood pressure and heart disease, as well as insomnia and depression. There is no diagnostic test for   stress since everyone reacts to it differently. But a caregiver will be able to spot the physical symptoms, such  as:  Headaches.  Shingles.  Ulcers. Emotional distress such as intense worry, low mood or irritability should be detected when the doctor asks pertinent questions to identify any underlying problems that might be the cause. In case there are physical reasons for the symptoms, the doctor may also want to do some tests to exclude certain conditions. If you feel that you are suffering from stress, try to identify the aspects of your life that are causing it. Sometimes you may not be able to change or avoid them, but even a small change can have a positive ripple effect. A simple lifestyle change can make all the difference. STRATEGIES THAT CAN HELP DEAL WITH STRESS:  Delegating or sharing responsibilities.  Avoiding confrontations.  Learning to be more assertive.  Regular exercise.  Avoid using alcohol or street drugs to cope.  Eating a healthy, balanced diet, rich in fruit and vegetables and proteins.  Finding humor or absurdity in stressful situations.  Never taking on more than you know you can handle comfortably.  Organizing your time better to get as much done as possible.  Talking to friends or family and sharing your thoughts and fears.  Listening to music or relaxation tapes.  Tensing and then relaxing your muscles, starting at the toes and working up to the head and neck. If you think that you would benefit from help, either in identifying the things that are causing your stress or in learning techniques to help you relax, see a caregiver who is capable of helping you with this. Rather than relying on medications, it is usually better to try and identify the things in your life that are causing stress and try to deal with them. There are many techniques of managing stress including counseling, psychotherapy, aromatherapy, yoga, and exercise. Your caregiver can help you determine what is best for you. Document Released: 01/19/2003 Document Revised: 01/21/2012 Document  Reviewed: 12/16/2007 [employer] Patient Information 2014 [employer].  

## 2013-06-25 NOTE — Progress Notes (Signed)
Subjective:    Patient ID: Melissa Schultz, female    DOB: 1966/04/19, 47 y.o.   MRN: [id number]  HPI 47 year old white female, nonsmoker, patient of Dr. Lovell Sheehan is in today with complaints of numbness and tingling in both side of her face that occurs approximately 2-3 times per month usually at night, over the last 1-2 months. She also reports numbness to her arms bilaterally. Denies any blurred vision, double vision, imbalance, aches or pains, no fatigue. Has a history of ocular migraines. Patient reports that she's been more stressed recently. She's had 3 deaths in her family over the last 8 months. One was sudden. She has a mother with multiple sclerosis and her health is declining. Reports that she had discontinued exercising, she usually runs several times a week.   Review of Systems  Constitutional: Negative.   HENT: Negative.   Respiratory: Negative.   Cardiovascular: Negative.   Gastrointestinal: Negative.   Endocrine: Negative.   Genitourinary: Negative.   Musculoskeletal: Negative.   Skin: Negative.   Neurological:       Tingling in her face several times a month  Hematological: Negative.   Psychiatric/Behavioral: Negative.    Past Medical History  Diagnosis Date  . Deviated septum   . Allergy   . Hyperlipidemia   . 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 Marital Status: Married    Spouse Name: N/A    Number of Children: N/A  . Years of Education: N/A   Occupational History  . Not on file.   Social History Main Topics  . Smoking status: Never Smoker   . Smokeless tobacco: Not on file  . Alcohol Use:   . Drug Use:   . Sexual Activity:    Other Topics Concern  . Not on file   Social History Narrative  . No narrative on file    Past Surgical History  Procedure Laterality Date  . Septoplasty      Family History  Problem Relation Age of Onset  . Multiple sclerosis Mother   . Diabetes Father     No Known  Allergies  Current Outpatient Prescriptions on File Prior to Visit  Medication Sig Dispense Refill  . Multiple Vitamin (MULTIVITAMIN) capsule Take 1 capsule by mouth daily.         No current facility-administered medications on file prior to visit.    BP 102/62  Pulse 70  Wt 121 lb (54.885 kg)  BMI 22.5 kg/m2chart    Objective:   Physical Exam  Constitutional: She is oriented to person, place, and time. She appears well-developed and well-nourished.  HENT:  Right Ear: External ear normal.  Left Ear: External ear normal.  Nose: Nose normal.  Mouth/Throat: Oropharynx is clear and moist.  Neck: Normal range of motion. Neck supple. No thyromegaly present.  Cardiovascular: Normal rate, regular rhythm and normal heart sounds.   Pulmonary/Chest: Effort normal and breath sounds normal.  Abdominal: Soft. Bowel sounds are normal.  Musculoskeletal: Normal range of motion.  Neurological: She is alert and oriented to person, place, and time. She has normal reflexes. She displays normal reflexes. No cranial nerve deficit. Coordination normal.  Skin: Skin is warm and dry.  Psychiatric: She has a normal mood and affect.          Assessment & Plan:  Assessment: 1. Bilateral facial numbness and tingling 2. Bilateral arm numbness and tingling 3. Acute stress reaction  Plan: Lab sent to include TSH, CBC, CMP  we will notify the patient had a results. I believe the underlying issue is stress. However, if her symptoms persist we can consider an MRI. Advised to resume exercise to help with serotonin levels.

## 2013-11-16 ENCOUNTER — Other Ambulatory Visit (INDEPENDENT_AMBULATORY_CARE_PROVIDER_SITE_OTHER): Payer: 59

## 2013-11-16 DIAGNOSIS — Z0000 Encounter for general adult medical examination without abnormal findings: Secondary | ICD-10-CM

## 2013-11-16 LAB — HEPATIC FUNCTION PANEL
ALBUMIN: 4.2 g/dL (ref 3.5–5.2)
ALT: 15 U/L (ref 0–35)
AST: 19 U/L (ref 0–37)
Alkaline Phosphatase: 46 U/L (ref 39–117)
BILIRUBIN DIRECT: 0.1 mg/dL (ref 0.0–0.3)
BILIRUBIN TOTAL: 0.9 mg/dL (ref 0.3–1.2)
Total Protein: 6.6 g/dL (ref 6.0–8.3)

## 2013-11-16 LAB — POCT URINALYSIS DIPSTICK
Bilirubin, UA: NEGATIVE
Glucose, UA: NEGATIVE
Ketones, UA: NEGATIVE
Leukocytes, UA: NEGATIVE
NITRITE UA: NEGATIVE
PH UA: 6
PROTEIN UA: NEGATIVE
RBC UA: NEGATIVE
Spec Grav, UA: 1.025
UROBILINOGEN UA: 0.2

## 2013-11-16 LAB — BASIC METABOLIC PANEL
BUN: 17 mg/dL (ref 6–23)
CALCIUM: 8.9 mg/dL (ref 8.4–10.5)
CHLORIDE: 105 meq/L (ref 96–112)
CO2: 28 mEq/L (ref 19–32)
Creatinine, Ser: 0.8 mg/dL (ref 0.4–1.2)
GFR: 84.12 mL/min (ref >60.00)
Glucose, Bld: 84 mg/dL (ref 70–99)
Potassium: 4.1 mEq/L (ref 3.5–5.1)
Sodium: 138 mEq/L (ref 135–145)

## 2013-11-16 LAB — CBC WITH DIFFERENTIAL/PLATELET
HCT: 34 % — ABNORMAL LOW (ref 36.0–46.0)
Hemoglobin: 10.8 g/dL — ABNORMAL LOW (ref 12.0–15.0)
MCHC: 31.9 g/dL (ref 30.0–36.0)
MCV: 58.5 fl — ABNORMAL LOW (ref 78.0–100.0)
Platelets: 249 10*3/uL (ref 150.0–400.0)
RBC: 5.82 Mil/uL — AB (ref 3.87–5.11)
RDW: 17 % — ABNORMAL HIGH (ref 11.5–14.6)
WBC: 5.3 10*3/uL (ref 4.5–10.5)

## 2013-11-16 LAB — LIPID PANEL
CHOL/HDL RATIO: 3
Cholesterol: 223 mg/dL — ABNORMAL HIGH (ref 0–200)
HDL: 74.1 mg/dL (ref >39.00)
TRIGLYCERIDES: 118 mg/dL (ref 0.0–149.0)
VLDL: 23.6 mg/dL (ref 0.0–40.0)

## 2013-11-16 LAB — TSH: TSH: 3.62 u[IU]/mL (ref 0.35–5.50)

## 2013-11-16 LAB — LDL CHOLESTEROL, DIRECT: Direct LDL: 128.7 mg/dL

## 2013-11-23 ENCOUNTER — Encounter: Payer: Self-pay | Admitting: Internal Medicine

## 2013-11-23 ENCOUNTER — Ambulatory Visit (INDEPENDENT_AMBULATORY_CARE_PROVIDER_SITE_OTHER): Payer: 59 | Admitting: Internal Medicine

## 2013-11-23 VITALS — BP 124/76 | HR 76 | Temp 98.2°F | Resp 16 | Ht 61.5 in | Wt 122.0 lb

## 2013-11-23 DIAGNOSIS — Z0000 Encounter for general adult medical examination without abnormal findings: Secondary | ICD-10-CM

## 2013-11-23 NOTE — Progress Notes (Signed)
Subjective:    Patient ID: Melissa Schultz, female    DOB: 1966-07-02, 48 y.o.   MRN: [id number]  HPI Patient has noted episodic early awakening without other associated symptoms Mild  Increased  Stressors due to husbands job change Other wise well    Review of Systems  Constitutional: Negative for activity change, appetite change and fatigue.  HENT: Negative for congestion, ear pain, postnasal drip and sinus pressure.   Eyes: Negative for redness and visual disturbance.  Respiratory: Negative for cough, shortness of breath and wheezing.   Gastrointestinal: Negative for abdominal pain and abdominal distention.  Genitourinary: Negative for dysuria, frequency and menstrual problem.  Musculoskeletal: Negative for arthralgias, joint swelling, myalgias and neck pain.  Skin: Negative for rash and wound.  Neurological: Negative for dizziness, weakness and headaches.  Hematological: Negative for adenopathy. Does not bruise/bleed easily.  Psychiatric/Behavioral: Negative for sleep disturbance and decreased concentration.   Past Medical History  Diagnosis Date  . Deviated septum   . Allergy   . Hyperlipidemia   . 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 Marital Status: Married    Spouse Name: N/A    Number of Children: N/A  . Years of Education: N/A   Occupational History  . Not on file.   Social History Main Topics  . Smoking status: Never Smoker   . Smokeless tobacco: Not on file  . Alcohol Use:   . Drug Use:   . Sexual Activity:    Other Topics Concern  . Not on file   Social History Narrative  . No narrative on file    Past Surgical History  Procedure Laterality Date  . Septoplasty      Family History  Problem Relation Age of Onset  . Multiple sclerosis Mother   . Diabetes Father     No Known Allergies  Current Outpatient Prescriptions on File Prior to Visit  Medication Sig Dispense Refill  . Multiple Vitamin (MULTIVITAMIN)  capsule Take 1 capsule by mouth daily.         No current facility-administered medications on file prior to visit.    BP 124/76  Pulse 76  Temp(Src) 98.2 F (36.8 C)  Resp 16  Ht 5' 1.5" (1.562 m)  Wt 122 lb (55.339 kg)  BMI 22.68 kg/m2       Objective:   Physical Exam  Constitutional: She is oriented to person, place, and time. She appears well-developed and well-nourished. No distress.  HENT:  Head: Normocephalic and atraumatic.  Right Ear: External ear normal.  Left Ear: External ear normal.  Nose: Nose normal.  Mouth/Throat: Oropharynx is clear and moist.  Eyes: Conjunctivae and EOM are normal. Pupils are equal, round, and reactive to light.  Neck: Normal range of motion. Neck supple. No JVD present. No tracheal deviation present. No thyromegaly present.  Cardiovascular: Normal rate, regular rhythm, normal heart sounds and intact distal pulses.   No murmur heard. Pulmonary/Chest: Effort normal and breath sounds normal. She has no wheezes. She exhibits no tenderness.  Abdominal: Soft. Bowel sounds are normal.  Musculoskeletal: Normal range of motion. She exhibits no edema and no tenderness.  Lymphadenopathy:    She has no cervical adenopathy.  Neurological: She is alert and oriented to person, place, and time. She has normal reflexes. No cranial nerve deficit.  Skin: Skin is warm and dry. She is not diaphoretic.  Psychiatric: She has a normal mood and affect. Her behavior is normal.  Assessment & Plan:   This is a routine physical examination for this healthy  Female. Reviewed all health maintenance protocols including mammography colonoscopy bone density and reviewed appropriate screening labs. Her immunization history was reviewed as well as her current medications and allergies refills of her chronic medications were given and the plan for yearly health maintenance was discussed all orders and referrals were made as appropriate.  Possible hypoglycemia vs  stress Night time protein snacks Meditations skills

## 2013-11-23 NOTE — Progress Notes (Signed)
Cina not available today

## 2013-11-23 NOTE — Patient Instructions (Signed)
The patient is instructed to continue all medications as prescribed. Schedule followup with check out clerk upon leaving the clinic  

## 2013-12-14 ENCOUNTER — Encounter: Payer: Self-pay | Admitting: Family Medicine

## 2013-12-14 ENCOUNTER — Ambulatory Visit (INDEPENDENT_AMBULATORY_CARE_PROVIDER_SITE_OTHER): Payer: 59 | Admitting: Family Medicine

## 2013-12-14 VITALS — BP 118/80 | HR 91 | Temp 97.6°F | Wt 127.0 lb

## 2013-12-14 DIAGNOSIS — R42 Dizziness and giddiness: Secondary | ICD-10-CM

## 2013-12-14 DIAGNOSIS — R55 Syncope and collapse: Secondary | ICD-10-CM

## 2013-12-14 NOTE — Patient Instructions (Signed)
Suspect you may have had episode of acute vestibular neuritis.  Be in touch if symptoms recur. Some mild residual vertigo is not unusual.

## 2013-12-14 NOTE — Progress Notes (Signed)
Pre visit review using our clinic review tool, if applicable. No additional management support is needed unless otherwise documented below in the visit note. 

## 2013-12-14 NOTE — Progress Notes (Signed)
   Subjective:    Patient ID: Melissa Schultz, female    DOB: December 09, 1965, 48 y.o.   MRN: [id number]  HPI Patient Saturday morning got up and felt "lightheaded" and had transient vertigo and transient syncope. Her husband had just arrived home from the airport. She stood up to get out of bed and had brief syncope. For the next 4 hours she had recurrent nausea and vomiting with head movement. No hearing changes. Her vomiting eventually resolved after about 4 hours. She has a history of vasovagal syncope in the past.  She has had some very mild residual vertigo off and on but much improved c/w Saturday morning.  She had absolutely no headache at that time and only mild headache Saturday. In the process of falling she had an abrasion to her nose. She had a neighbor who is a Psychologist, sport and exercise who came over and evaluated her. She did have some urine incontinence with episode which she's had previously with vasovagal episodes. There were no post ictal type symptoms.  She still has some very transient vertigo today but overall greatly improved and no further vomiting after the initial 4 hours. No fevers or chills. Patient said she had extensive workup for syncope about 20 years ago which was normal. She has history of thalassemia minor and recent hemoglobin 10.8 which is stable for her. No recent bleeding issues. No diarrhea except for one episode Saturday morning. Last episode of syncope was approximately 10 years ago.  Past Medical History  Diagnosis Date  . Deviated septum   . Allergy   . Hyperlipidemia   . 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 Septoplasty      reports that she has never smoked. She does not have any smokeless tobacco history on file. Her alcohol and drug histories are not on file. family history includes Diabetes in her father; Multiple sclerosis in her mother. No Known Allergies    Review of Systems  Constitutional: Negative for fever,  chills and fatigue.  Gastrointestinal: Negative for abdominal pain.  Genitourinary: Negative for dysuria.  Neurological: Positive for syncope. Negative for headaches.  Psychiatric/Behavioral: Negative for confusion.       Objective:   Physical Exam  Constitutional: She is oriented to person, place, and time. She appears well-developed and well-nourished.  HENT:  Mouth/Throat: Oropharynx is clear and moist.  Neck: Neck supple. No thyromegaly present.  Cardiovascular: Normal rate and regular rhythm.   Pulmonary/Chest: Effort normal and breath sounds normal. No respiratory distress. She has no wheezes. She has no rales.  Neurological: She is alert and oriented to person, place, and time. No cranial nerve deficit. She exhibits normal muscle tone. Coordination normal.  Psychiatric: She has a normal mood and affect. Her behavior is normal. Judgment and thought content normal.          Assessment & Plan:  Transient syncope. She has long history of vasovagal syncope. She is describing what sounds like possible episode of acute vestibular neuritis and question whether her nausea may have triggered vasovagal syncope. No clinical suspicion of seizure. EKG shows sinus rhythm with no acute changes. Nonfocal neuro exam at this time. She has not had any headaches, hearing changes, or other focal worrisome neurologic symptoms. We've recommended observation this time. We discussed possible vestibular rehabilitation but her symptoms are very mild. Followup immediately for any recurrent syncope or other concerns.

## 2014-02-23 DIAGNOSIS — M6740 Ganglion, unspecified site: Secondary | ICD-10-CM | POA: Insufficient documentation

## 2014-08-02 ENCOUNTER — Other Ambulatory Visit: Payer: Self-pay | Admitting: Dermatology

## 2014-09-21 ENCOUNTER — Other Ambulatory Visit: Payer: Self-pay | Admitting: Obstetrics and Gynecology

## 2014-09-22 LAB — CYTOLOGY - PAP

## 2014-11-17 ENCOUNTER — Other Ambulatory Visit: Payer: 59

## 2014-11-24 ENCOUNTER — Encounter: Payer: 59 | Admitting: Internal Medicine

## 2016-11-12 HISTORY — PX: COLONOSCOPY: SHX174

## 2017-04-15 ENCOUNTER — Encounter: Payer: Self-pay | Admitting: Gastroenterology

## 2017-06-10 ENCOUNTER — Ambulatory Visit (AMBULATORY_SURGERY_CENTER): Payer: Self-pay | Admitting: *Deleted

## 2017-06-10 ENCOUNTER — Encounter: Payer: Self-pay | Admitting: Gastroenterology

## 2017-06-10 VITALS — Ht 63.0 in | Wt 123.0 lb

## 2017-06-10 DIAGNOSIS — Z1211 Encounter for screening for malignant neoplasm of colon: Secondary | ICD-10-CM

## 2017-06-10 MED ORDER — NA SULFATE-K SULFATE-MG SULF 17.5-3.13-1.6 GM/177ML PO SOLN: ORAL | 0 refills | Status: DC

## 2017-06-10 NOTE — Progress Notes (Signed)
Patient denies any allergies to eggs or soy. Patient denies any problems with anesthesia/sedation. Patient denies any oxygen use at home and does not take any diet/weight loss medications. EMMI education assisgned to patient on colonoscopy, this was explained and instructions given to patient. 

## 2017-06-24 ENCOUNTER — Encounter: Payer: Self-pay | Admitting: Gastroenterology

## 2017-06-24 ENCOUNTER — Ambulatory Visit (AMBULATORY_SURGERY_CENTER): Payer: 59 | Admitting: Gastroenterology

## 2017-06-24 VITALS — BP 103/68 | HR 65 | Temp 98.7°F | Resp 25 | Ht 63.0 in | Wt 123.0 lb

## 2017-06-24 DIAGNOSIS — Z1212 Encounter for screening for malignant neoplasm of rectum: Secondary | ICD-10-CM | POA: Diagnosis not present

## 2017-06-24 DIAGNOSIS — D124 Benign neoplasm of descending colon: Secondary | ICD-10-CM

## 2017-06-24 DIAGNOSIS — Z1211 Encounter for screening for malignant neoplasm of colon: Secondary | ICD-10-CM

## 2017-06-24 MED ORDER — SODIUM CHLORIDE 0.9 % IV SOLN: 500.0000 mL | INTRAVENOUS | Status: DC

## 2017-06-24 NOTE — Op Note (Signed)
[facility] Patient Name: Melissa Schultz Procedure Date: 06/24/2017 10:18 AM MRN: [id number] Endoscopist: Milus Banister , MD Age: 51 Referring MD:  Date of Birth: [birth year] Gender: Female Account #: [id number] Procedure:                Colonoscopy Indications:              Screening for colorectal malignant neoplasm Medicines:                Monitored Anesthesia Care Procedure:                Pre-Anesthesia Assessment:                           - Prior to the procedure, a History and Physical                            was performed, and patient medications and                            allergies were reviewed. The patient's tolerance of                            previous anesthesia was also reviewed. The risks                            and benefits of the procedure and the sedation                            options and risks were discussed with the patient.                            All questions were answered, and informed consent                            was obtained. Prior Anticoagulants: The patient has                            taken no previous anticoagulant or antiplatelet                            agents. ASA Grade Assessment: II - A patient with                            mild systemic disease. After reviewing the risks                            and benefits, the patient was deemed in                            satisfactory condition to undergo the procedure.                           After obtaining informed consent, the colonoscope  was passed under direct vision. Throughout the                            procedure, the patient's blood pressure, pulse, and                            oxygen saturations were monitored continuously. The                            Colonoscope was introduced through the anus and                            advanced to the the cecum, identified by                            appendiceal orifice  and ileocecal valve. The                            colonoscopy was performed without difficulty. The                            patient tolerated the procedure well. The quality                            of the bowel preparation was excellent. The                            ileocecal valve, appendiceal orifice, and rectum                            were photographed. Scope In: 10:27:48 AM Scope Out: 10:40:43 AM Scope Withdrawal Time: 0 hours 7 minutes 53 seconds  Total Procedure Duration: 0 hours 12 minutes 55 seconds  Findings:                 A 3 mm polyp was found in the descending colon. The                            polyp was sessile. The polyp was removed with a                            cold snare. Resection and retrieval were complete.                           The exam was otherwise without abnormality on                            direct and retroflexion views. Complications:            No immediate complications. Estimated blood loss:                            None. Estimated Blood Loss:     Estimated blood loss: none. Impression:               -  One 3 mm polyp in the descending colon, removed                            with a cold snare. Resected and retrieved.                           - The examination was otherwise normal on direct                            and retroflexion views. Recommendation:           - Patient has a contact number available for                            emergencies. The signs and symptoms of potential                            delayed complications were discussed with the                            patient. Return to normal activities tomorrow.                            Written discharge instructions were provided to the                            patient.                           - Resume previous diet.                           - Continue present medications.                           You will receive a letter within 2-3 weeks with the                             pathology results and my final recommendations.                           If the polyp(s) is proven to be 'pre-cancerous' on                            pathology, you will need repeat colonoscopy in 5                            years. If the polyp(s) is NOT 'precancerous' on                            pathology then you should repeat colon cancer                            screening in 10 years with colonoscopy without need  for colon cancer screening by any method prior to                            then (including stool testing). Milus Banister, MD 06/24/2017 10:42:51 AM This report has been signed electronically.

## 2017-06-24 NOTE — Progress Notes (Signed)
Called to room to assist during endoscopic procedure.  Patient ID and intended procedure confirmed with present staff. Received instructions for my participation in the procedure from the performing physician.  

## 2017-06-24 NOTE — Progress Notes (Signed)
Pt's states no medical or surgical changes since previsit or office visit. maw 

## 2017-06-24 NOTE — Progress Notes (Signed)
Report to PACU, RN, vss, BBS= Clear.  

## 2017-06-24 NOTE — Patient Instructions (Signed)
YOU HAD AN ENDOSCOPIC PROCEDURE TODAY AT [facility]:   Refer to the procedure report that was given to you for any specific questions about what was found during the examination.  If the procedure report does not answer your questions, please call your gastroenterologist to clarify.  If you requested that your care partner not be given the details of your procedure findings, then the procedure report has been included in a sealed envelope for you to review at your convenience later.  YOU SHOULD EXPECT: Some feelings of bloating in the abdomen. Passage of more gas than usual.  Walking can help get rid of the air that was put into your GI tract during the procedure and reduce the bloating. If you had a lower endoscopy (such as a colonoscopy or flexible sigmoidoscopy) you may notice spotting of blood in your stool or on the toilet paper. If you underwent a bowel prep for your procedure, you may not have a normal bowel movement for a few days.  Please Note:  You might notice some irritation and congestion in your nose or some drainage.  This is from the oxygen used during your procedure.  There is no need for concern and it should clear up in a day or so.  SYMPTOMS TO REPORT IMMEDIATELY:   Following lower endoscopy (colonoscopy or flexible sigmoidoscopy):  Excessive amounts of blood in the stool  Significant tenderness or worsening of abdominal pains  Swelling of the abdomen that is new, acute  Fever of 100F or higher    For urgent or emergent issues, a gastroenterologist can be reached at any hour by calling [phone].   DIET:  We do recommend a small meal at first, but then you may proceed to your regular diet.  Drink plenty of fluids but you should avoid alcoholic beverages for 24 hours.  ACTIVITY:  You should plan to take it easy for the rest of today and you should NOT DRIVE or use heavy machinery until tomorrow (because of the sedation medicines used during the test).     FOLLOW UP: Our staff will call the number listed on your records the next business day following your procedure to check on you and address any questions or concerns that you may have regarding the information given to you following your procedure. If we do not reach you, we will leave a message.  However, if you are feeling well and you are not experiencing any problems, there is no need to return our call.  We will assume that you have returned to your regular daily activities without incident.  If any biopsies were taken you will be contacted by phone or by letter within the next 1-3 weeks.  Please call us at [phone] if you have not heard about the biopsies in 3 weeks.    SIGNATURES/CONFIDENTIALITY: You and/or your care partner have signed paperwork which will be entered into your electronic medical record.  These signatures attest to the fact that that the information above on your After Visit Summary has been reviewed and is understood.  Full responsibility of the confidentiality of this discharge information lies with you and/or your care-partner.    INFORMATION ON POLYPS GIVEN TO YOU TODAY  AWAIT PATHOLOGY RESULTS FROM DR Ardis Hughs

## 2017-06-25 ENCOUNTER — Telehealth: Payer: Self-pay

## 2017-06-25 NOTE — Telephone Encounter (Signed)
  Follow up Call-  Call back number 06/24/2017  Post procedure Call Back phone  # [phone] cell  Permission to leave phone message Yes  Some recent data might be hidden     Left message

## 2017-06-25 NOTE — Telephone Encounter (Signed)
  Follow up Call-  Call back number 06/24/2017  Post procedure Call Back phone  # [phone] cell  Permission to leave phone message Yes  Some recent data might be hidden     Patient questions:  Do you have a fever, pain , or abdominal swelling? No. Pain Score  0 *  Have you tolerated food without any problems? Yes.    Have you been able to return to your normal activities? Yes.    Do you have any questions about your discharge instructions: Diet   No. Medications  No. Follow up visit  No.  Do you have questions or concerns about your Care? No.  Actions: * If pain score is 4 or above: No action needed, pain <4.

## 2017-06-30 ENCOUNTER — Encounter: Payer: Self-pay | Admitting: Gastroenterology

## 2019-09-30 ENCOUNTER — Other Ambulatory Visit: Payer: Self-pay

## 2019-09-30 DIAGNOSIS — Z20822 Contact with and (suspected) exposure to covid-19: Secondary | ICD-10-CM

## 2019-10-02 LAB — NOVEL CORONAVIRUS, NAA: SARS-CoV-2, NAA: NOT DETECTED

## 2021-11-01 ENCOUNTER — Telehealth: Payer: Self-pay | Admitting: Oncology

## 2021-11-01 NOTE — Telephone Encounter (Signed)
Scheduled appt per 12/21 referral. Pt is aware of appt date and time.

## 2021-11-16 ENCOUNTER — Inpatient Hospital Stay: Payer: 59 | Attending: Oncology | Admitting: Oncology

## 2021-11-16 ENCOUNTER — Other Ambulatory Visit: Payer: Self-pay

## 2021-11-16 VITALS — BP 125/76 | HR 66 | Temp 97.6°F | Resp 18 | Wt 133.2 lb

## 2021-11-16 DIAGNOSIS — E785 Hyperlipidemia, unspecified: Secondary | ICD-10-CM | POA: Insufficient documentation

## 2021-11-16 DIAGNOSIS — D509 Iron deficiency anemia, unspecified: Secondary | ICD-10-CM | POA: Insufficient documentation

## 2021-11-16 DIAGNOSIS — D568 Other thalassemias: Secondary | ICD-10-CM

## 2021-11-16 DIAGNOSIS — Z90710 Acquired absence of both cervix and uterus: Secondary | ICD-10-CM | POA: Diagnosis not present

## 2021-11-16 NOTE — Progress Notes (Signed)
Reason for the request:    Anemia  HPI: I was asked by Dr. Ernie Hew to evaluate Melissa Schultz for the evaluation of anemia.  This is a 56 year old woman with history of hyperlipidemia and known history of thalassemia dating back to 2009.  At that time her hemoglobin was 10.7 with MCV 58.8.  Hemoglobin electrophoresis obtained at that time showed a hemoglobin A of 92.7, hemoglobin A2 of 4.9 and a hemoglobin F of 2.4.  Hemoglobin S was 0.  CBC obtained on October 27, 2021 showed a hemoglobin of 10.7, white cell count of 4.7 and a platelet count of 238.  Her MCV is 59.9.  Clinically, she reports no other complaints at this time.  She denies any shortness of breath, difficulty breathing or excessive fatigue.  She denies any recent hospitalizations or illnesses.  She denies any limitations on her work-up which she does regularly.   She does not report any headaches, blurry vision, syncope or seizures. Does not report any fevers, chills or sweats.  Does not report any cough, wheezing or hemoptysis.  Does not report any chest pain, palpitation, orthopnea or leg edema.  Does not report any nausea, vomiting or abdominal pain.  Does not report any constipation or diarrhea.  Does not report any skeletal complaints.    Does not report frequency, urgency or hematuria.  Does not report any skin rashes or lesions. Does not report any heat or cold intolerance.  Does not report any lymphadenopathy or petechiae.  Does not report any anxiety or depression.  Remaining review of systems is negative.     Past Medical History:  Diagnosis Date   Allergy    Deviated septum    Hyperlipidemia    Other thalassemia ([place]) 08/17/2008   Post-operative nausea and vomiting    Seizures ([facility])    last seizure 10 years ago per pt  :   Past Surgical History:  Procedure Laterality Date   ABDOMINAL HYSTERECTOMY     CESAREAN SECTION     EXPLORATORY LAPAROTOMY     SEPTOPLASTY    :   Current Outpatient Medications:    acetaminophen  (TYLENOL) 500 MG tablet, Take 500 mg by mouth every 6 (six) hours as needed., Disp: , Rfl:    Cholecalciferol (VITAMIN D3) 5000 units TABS, Take 1 tablet by mouth daily., Disp: , Rfl:    escitalopram (LEXAPRO) 5 MG tablet, Take 5 mg by mouth daily., Disp: , Rfl:    Ferrous Sulfate (IRON) 325 (65 Fe) MG TABS, Take 1 tablet by mouth daily., Disp: , Rfl:    ibuprofen (ADVIL,MOTRIN) 800 MG tablet, Take 800 mg by mouth as needed., Disp: , Rfl:    loratadine (CLARITIN) 10 MG tablet, Take 10 mg by mouth daily., Disp: , Rfl:   Current Facility-Administered Medications:    0.9 %  sodium chloride infusion, 500 mL, Intravenous, Continuous, Milus Banister, MD:  No Known Allergies:   Family History  Problem Relation Age of Onset   Multiple sclerosis Mother    Diabetes Father    Colon cancer Neg Hx    Esophageal cancer Neg Hx    Pancreatic cancer Neg Hx    Rectal cancer Neg Hx    Stomach cancer Neg Hx   :   Social History   Socioeconomic History   Marital status: Married    Spouse name: Not on file   Number of children: Not on file   Years of education: Not on file   Highest education level:  Not on file  Occupational History   Not on file  Tobacco Use   Smoking status: Never   Smokeless tobacco: Never  Vaping Use   Vaping Use: Never used  Substance and Sexual Activity   Alcohol use: Yes    Alcohol/week: 7.0 standard drinks    Types: 7 Glasses of wine per week    Comment: wine daily per pt   Drug use: No   Sexual activity: Not on file    Comment: hysterectomy  Other Topics Concern   Not on file  Social History Narrative   Not on file   Social Determinants of Health   Financial Resource Strain: Not on file  Food Insecurity: Not on file  Transportation Needs: Not on file  Physical Activity: Not on file  Stress: Not on file  Social Connections: Not on file  Intimate Partner Violence: Not on file  :  Pertinent items are noted in HPI.  Exam: Blood pressure 125/76,  pulse 66, temperature 97.6 F (36.4 C), resp. rate 18, weight 133 lb 4 oz (60.4 kg), SpO2 100 %.  ECOG 0 General appearance: alert and cooperative appeared without distress. Head: atraumatic without any abnormalities. Eyes: conjunctivae/corneas clear. PERRL.  Sclera anicteric. Throat: lips, mucosa, and tongue normal; without oral thrush or ulcers. Resp: clear to auscultation bilaterally without rhonchi, wheezes or dullness to percussion. Cardio: regular rate and rhythm, S1, S2 normal, no murmur, click, rub or gallop GI: soft, non-tender; bowel sounds normal; no masses,  no organomegaly Skin: Skin color, texture, turgor normal. No rashes or lesions Lymph nodes: Cervical, supraclavicular, and axillary nodes normal. Neurologic: Grossly normal without any motor, sensory or deep tendon reflexes. Musculoskeletal: No joint deformity or effusion.   Assessment and Plan:   56 year old woman with:  1.  Microcytic anemia dating back to 2009 with a hemoglobin of 10.7 and hemoglobin electrophoresis shows findings suggestive of thalassemia.  Iron studies at that time were within normal range.  Repeat laboratory testing in December 2022 showed a hemoglobin that is consistent with her baseline.  The natural course of this disease was reviewed at this time and treatment choices were discussed.  Her hemoglobin remains at baseline without any need for intervention at this time.  Iron supplementation will add no benefit at this time given that her iron stores are likely repleted.  Growth factor support in the form of Procrit or Aranesp would be reasonable in the future if her erythropoietin level is low.  At this time I see no need for intervention given the fact that her hemoglobin is close to her baseline and does not require any intervention.  I have recommended no additional work-up or intervention at this time.   2.  Follow-up: will be as needed in the future.   45  minutes were dedicated to this  visit. The time was spent on reviewing laboratory data, discussing treatment options, discussing differential diagnosis and answering questions regarding future plan.     Thank you for the referral.  I had the pleasure of meeting this patient today.  A copy of this consult has been forwarded to the requesting physician.

## 2022-04-23 ENCOUNTER — Encounter: Payer: Self-pay | Admitting: Gastroenterology

## 2022-05-07 ENCOUNTER — Encounter: Payer: Self-pay | Admitting: Gastroenterology

## 2022-06-20 ENCOUNTER — Ambulatory Visit (AMBULATORY_SURGERY_CENTER): Payer: Self-pay

## 2022-06-20 VITALS — Ht 62.0 in | Wt 131.0 lb

## 2022-06-20 DIAGNOSIS — Z86010 Personal history of colonic polyps: Secondary | ICD-10-CM

## 2022-06-20 MED ORDER — NA SULFATE-K SULFATE-MG SULF 17.5-3.13-1.6 GM/177ML PO SOLN: 1.0000 | Freq: Once | ORAL | 0 refills | Status: AC

## 2022-06-20 NOTE — Progress Notes (Signed)
No egg or soy allergy known to patient  No issues known to pt with past sedation with any surgeries or procedures Patient denies ever being told they had issues or difficulty with intubation  No FH of Malignant Hyperthermia Pt is not on diet pills Pt is not on home 02  Pt is not on blood thinners  Pt denies issues with constipation  No A fib or A flutter Have any cardiac testing pending--NO Pt instructed to use [employer] or GoodRx for a price reduction on prep   

## 2022-07-04 ENCOUNTER — Encounter: Payer: Self-pay | Admitting: Gastroenterology

## 2022-07-08 ENCOUNTER — Encounter: Payer: Self-pay | Admitting: Certified Registered Nurse Anesthetist

## 2022-07-11 ENCOUNTER — Telehealth: Payer: Self-pay | Admitting: Gastroenterology

## 2022-07-11 NOTE — Telephone Encounter (Signed)
Returned call to patient. I informed her that we can proceed as scheduled. I advised pt to make sure she follows diet moving forward. Pt verbalized understanding and had no concerns at the end of the call.

## 2022-07-11 NOTE — Telephone Encounter (Signed)
Patient called, has colon scheduled for 9/1, states she consumed some nuts yesterday, and wondering if that will affect the prep or if she has to reschedule the procedure. Requesting a call back.Please call to advise.

## 2022-07-13 ENCOUNTER — Ambulatory Visit (AMBULATORY_SURGERY_CENTER): Payer: 59 | Admitting: Gastroenterology

## 2022-07-13 ENCOUNTER — Encounter: Payer: Self-pay | Admitting: Gastroenterology

## 2022-07-13 ENCOUNTER — Encounter: Payer: 59 | Admitting: Gastroenterology

## 2022-07-13 VITALS — BP 93/65 | HR 68 | Temp 98.0°F | Resp 17 | Ht 62.0 in | Wt 126.0 lb

## 2022-07-13 DIAGNOSIS — K5730 Diverticulosis of large intestine without perforation or abscess without bleeding: Secondary | ICD-10-CM

## 2022-07-13 DIAGNOSIS — D120 Benign neoplasm of cecum: Secondary | ICD-10-CM

## 2022-07-13 DIAGNOSIS — R197 Diarrhea, unspecified: Secondary | ICD-10-CM | POA: Diagnosis not present

## 2022-07-13 DIAGNOSIS — Z86010 Personal history of colonic polyps: Secondary | ICD-10-CM

## 2022-07-13 MED ORDER — SODIUM CHLORIDE 0.9 % IV SOLN: 500.0000 mL | Freq: Once | INTRAVENOUS | Status: DC

## 2022-07-13 NOTE — Op Note (Signed)
[facility] Patient Name: Melissa Schultz Procedure Date: 07/13/2022 9:44 AM MRN: [id number] Endoscopist: Remo Lipps P. Havery Moros , MD Age: 56 Referring MD:  Date of Birth: 1966/09/11 Gender: Female Account #: [id number] Procedure:                Colonoscopy Indications:              High risk colon cancer surveillance: Personal                            history of colonic polyps - adenoma removed 06/2017                            - Dr. Ardis Hughs. Incidental - patient reports chronic                            loose stools Medicines:                Monitored Anesthesia Care Procedure:                Pre-Anesthesia Assessment:                           - Prior to the procedure, a History and Physical                            was performed, and patient medications and                            allergies were reviewed. The patient's tolerance of                            previous anesthesia was also reviewed. The risks                            and benefits of the procedure and the sedation                            options and risks were discussed with the patient.                            All questions were answered, and informed consent                            was obtained. Prior Anticoagulants: The patient has                            taken no previous anticoagulant or antiplatelet                            agents. ASA Grade Assessment: III - A patient with                            severe systemic disease. After reviewing the risks  and benefits, the patient was deemed in                            satisfactory condition to undergo the procedure.                           After obtaining informed consent, the colonoscope                            was passed under direct vision. Throughout the                            procedure, the patient's blood pressure, pulse, and                            oxygen saturations were monitored  continuously. The                            Olympus scope [id number] was introduced through the                            anus and advanced to the the terminal ileum, with                            identification of the appendiceal orifice and IC                            valve. The colonoscopy was performed without                            difficulty. The patient tolerated the procedure                            well. The quality of the bowel preparation was                            good. The terminal ileum, ileocecal valve,                            appendiceal orifice, and rectum were photographed. Scope In: 9:48:57 AM Scope Out: 10:04:22 AM Scope Withdrawal Time: 0 hours 11 minutes 20 seconds  Total Procedure Duration: 0 hours 15 minutes 25 seconds  Findings:                 The perianal and digital rectal examinations were                            normal.                           The terminal ileum appeared normal.                           A 3 mm polyp was found in the cecum. The polyp was  flat. The polyp was removed with a cold biopsy                            forceps. Resection and retrieval were complete.                           A few small-mouthed diverticula were found in the                            sigmoid colon and ascending colon.                           Internal hemorrhoids were found during                            retroflexion. The hemorrhoids were small.                           The exam was otherwise without abnormality.                           Biopsies for histology were taken with a cold                            forceps from the right colon, left colon and                            transverse colon for evaluation of microscopic                            colitis. Complications:            No immediate complications. Estimated blood loss:                            Minimal. Estimated Blood Loss:     Estimated blood  loss was minimal. Impression:               - The examined portion of the ileum was normal.                           - One 3 mm polyp in the cecum, removed with a cold                            biopsy forceps. Resected and retrieved.                           - Diverticulosis in the sigmoid colon and in the                            ascending colon.                           - Internal hemorrhoids.                           -  The examination was otherwise normal.                           - Biopsies were taken with a cold forceps from the                            right colon, left colon and transverse colon for                            evaluation of microscopic colitis. Recommendation:           - Patient has a contact number available for                            emergencies. The signs and symptoms of potential                            delayed complications were discussed with the                            patient. Return to normal activities tomorrow.                            Written discharge instructions were provided to the                            patient.                           - Resume previous diet.                           - Continue present medications.                           - Await pathology results. Remo Lipps P. Celisse Ciulla, MD 07/13/2022 10:08:07 AM This report has been signed electronically.

## 2022-07-13 NOTE — Progress Notes (Signed)
[facility] History and Physical   Primary Care Physician:  Fanny Bien, MD   Reason for Procedure:   History of colon polyps  Plan:    colonoscopy     HPI: AMIAYA Schultz is a 56 y.o. female  here for colonoscopy surveillance - one adenoma removed 06/2017. Patient does endorse some chronic loose stools at baseline. No family history of colon cancer known. Otherwise feels well without any cardiopulmonary symptoms.   I have discussed risks / benefits of anesthesia and endoscopic procedure with Dallas Schimke and they wish to proceed with the exams as outlined today.    Past Medical History:  Diagnosis Date   Allergy    Anxiety    on meds   Deviated septum    GERD (gastroesophageal reflux disease)    with certain foods   Hyperlipidemia    on meds   Other thalassemia ([place]) 08/17/2008   Post-operative nausea and vomiting    Seizures ([place])    2000   Thyroid disease    on meds    Past Surgical History:  Procedure Laterality Date   ABDOMINAL HYSTERECTOMY  2010   CESAREAN SECTION  2003   COLONOSCOPY  2018   DJ-MAC-suprep(exc)-TA   EXPLORATORY LAPAROTOMY  2000   SEPTOPLASTY  2013    Prior to Admission medications   Medication Sig Start Date End Date Taking? Authorizing Provider  escitalopram (LEXAPRO) 10 MG tablet Take 10 mg by mouth daily. 06/04/22  Yes [provider]  raloxifene (EVISTA) 60 MG tablet Take 60 mg by mouth daily. 06/12/22  Yes [provider]  rosuvastatin (CRESTOR) 10 MG tablet Take 10 mg by mouth at bedtime. 02/10/22  Yes [provider]  SYNTHROID 75 MCG tablet Take 75 mcg by mouth daily. 04/17/22  Yes [provider]  acetaminophen (TYLENOL) 500 MG tablet Take 500 mg by mouth every 6 (six) hours as needed.    [provider]  Azelastine HCl 137 MCG/SPRAY SOLN Place 1 spray into the nose daily as needed.    [provider]  Cholecalciferol (VITAMIN D3) 5000 units TABS Take 1 tablet  by mouth daily.    [provider]  Ferrous Sulfate (IRON) 325 (65 Fe) MG TABS Take 1 tablet by mouth daily.    [provider]  ibuprofen (ADVIL,MOTRIN) 800 MG tablet Take 800 mg by mouth as needed. 03/22/14   [provider]  loratadine (CLARITIN) 10 MG tablet Take 10 mg by mouth daily as needed.    [provider]    Current Outpatient Medications  Medication Sig Dispense Refill   escitalopram (LEXAPRO) 10 MG tablet Take 10 mg by mouth daily.     raloxifene (EVISTA) 60 MG tablet Take 60 mg by mouth daily.     rosuvastatin (CRESTOR) 10 MG tablet Take 10 mg by mouth at bedtime.     SYNTHROID 75 MCG tablet Take 75 mcg by mouth daily.     acetaminophen (TYLENOL) 500 MG tablet Take 500 mg by mouth every 6 (six) hours as needed.     Azelastine HCl 137 MCG/SPRAY SOLN Place 1 spray into the nose daily as needed.     Cholecalciferol (VITAMIN D3) 5000 units TABS Take 1 tablet by mouth daily.     Ferrous Sulfate (IRON) 325 (65 Fe) MG TABS Take 1 tablet by mouth daily.     ibuprofen (ADVIL,MOTRIN) 800 MG tablet Take 800 mg by mouth as needed.     loratadine (CLARITIN)  10 MG tablet Take 10 mg by mouth daily as needed.     Current Facility-Administered Medications  Medication Dose Route Frequency Provider Last Rate Last Admin   0.9 %  sodium chloride infusion  500 mL Intravenous Once Duana Benedict, Carlota Raspberry, MD        Allergies as of 07/13/2022   (No Known Allergies)    Family History  Problem Relation Age of Onset   Multiple sclerosis Mother    Diabetes Father    Colon cancer Neg Hx    Esophageal cancer Neg Hx    Pancreatic cancer Neg Hx    Rectal cancer Neg Hx    Stomach cancer Neg Hx    Colon polyps Neg Hx     Social History   Socioeconomic History   Marital status: Married    Spouse name: Not on file   Number of children: Not on file   Years of education: Not on file   Highest education level: Not on file  Occupational History   Not on file   Tobacco Use   Smoking status: Never   Smokeless tobacco: Never  Vaping Use   Vaping Use: Never used  Substance and Sexual Activity   Alcohol use: Yes    Alcohol/week: 14.0 standard drinks of alcohol    Types: 14 Glasses of wine per week    Comment: wine daily per pt   Drug use: No   Sexual activity: Not on file    Comment: hysterectomy  Other Topics Concern   Not on file  Social History Narrative   Not on file   Social Determinants of Health   Financial Resource Strain: Not on file  Food Insecurity: Not on file  Transportation Needs: Not on file  Physical Activity: Not on file  Stress: Not on file  Social Connections: Not on file  Intimate Partner Violence: Not on file    Review of Systems: All other review of systems negative except as mentioned in the HPI.  Physical Exam: Vital signs Temp 98 F (36.7 C) (Temporal)   General:   Alert,  Well-developed, pleasant and cooperative in NAD Lungs:  Clear throughout to auscultation.   Heart:  Regular rate and rhythm Abdomen:  Soft, nontender and nondistended.   Neuro/Psych:  Alert and cooperative. Normal mood and affect. A and O x 3  Jolly Mango, MD [facility]

## 2022-07-13 NOTE — Progress Notes (Signed)
Pt's states no medical or surgical changes since previsit or office visit. 

## 2022-07-13 NOTE — Patient Instructions (Signed)
Resume previous diet and medications. Awaiting pathology results. Repeat Colonoscopy date to be determined based on pathology.  YOU HAD AN ENDOSCOPIC PROCEDURE TODAY AT [facility]:   Refer to the procedure report that was given to you for any specific questions about what was found during the examination.  If the procedure report does not answer your questions, please call your gastroenterologist to clarify.  If you requested that your care partner not be given the details of your procedure findings, then the procedure report has been included in a sealed envelope for you to review at your convenience later.  YOU SHOULD EXPECT: Some feelings of bloating in the abdomen. Passage of more gas than usual.  Walking can help get rid of the air that was put into your GI tract during the procedure and reduce the bloating. If you had a lower endoscopy (such as a colonoscopy or flexible sigmoidoscopy) you may notice spotting of blood in your stool or on the toilet paper. If you underwent a bowel prep for your procedure, you may not have a normal bowel movement for a few days.  Please Note:  You might notice some irritation and congestion in your nose or some drainage.  This is from the oxygen used during your procedure.  There is no need for concern and it should clear up in a day or so.  SYMPTOMS TO REPORT IMMEDIATELY:  Following lower endoscopy (colonoscopy or flexible sigmoidoscopy):  Excessive amounts of blood in the stool  Significant tenderness or worsening of abdominal pains  Swelling of the abdomen that is new, acute  Fever of 100F or higher  For urgent or emergent issues, a gastroenterologist can be reached at any hour by calling [phone]. Do not use MyChart messaging for urgent concerns.    DIET:  We do recommend a small meal at first, but then you may proceed to your regular diet.  Drink plenty of fluids but you should avoid alcoholic beverages for 24 hours.  ACTIVITY:   You should plan to take it easy for the rest of today and you should NOT DRIVE or use heavy machinery until tomorrow (because of the sedation medicines used during the test).    FOLLOW UP: Our staff will call the number listed on your records the next business day following your procedure.  We will call around 7:15- 8:00 am to check on you and address any questions or concerns that you may have regarding the information given to you following your procedure. If we do not reach you, we will leave a message.  If you develop any symptoms (ie: fever, flu-like symptoms, shortness of breath, cough etc.) before then, please call [phone].  If you test positive for Covid 19 in the 2 weeks post procedure, please call and report this information to [place].    If any biopsies were taken you will be contacted by phone or by letter within the next 1-3 weeks.  Please call us at [phone] if you have not heard about the biopsies in 3 weeks.    SIGNATURES/CONFIDENTIALITY: You and/or your care partner have signed paperwork which will be entered into your electronic medical record.  These signatures attest to the fact that that the information above on your After Visit Summary has been reviewed and is understood.  Full responsibility of the confidentiality of this discharge information lies with you and/or your care-partner.

## 2022-07-13 NOTE — Progress Notes (Signed)
Report given to PACU, vss 

## 2022-07-17 ENCOUNTER — Telehealth: Payer: Self-pay

## 2022-07-17 NOTE — Telephone Encounter (Signed)
Left message on answering machine. 

## 2023-08-26 ENCOUNTER — Encounter: Payer: Self-pay | Admitting: Podiatry

## 2023-08-26 ENCOUNTER — Ambulatory Visit (INDEPENDENT_AMBULATORY_CARE_PROVIDER_SITE_OTHER): Payer: 59 | Admitting: Podiatry

## 2023-08-26 ENCOUNTER — Ambulatory Visit (INDEPENDENT_AMBULATORY_CARE_PROVIDER_SITE_OTHER): Payer: 59

## 2023-08-26 DIAGNOSIS — M7989 Other specified soft tissue disorders: Secondary | ICD-10-CM

## 2023-08-26 DIAGNOSIS — M79672 Pain in left foot: Secondary | ICD-10-CM | POA: Diagnosis not present

## 2023-08-26 DIAGNOSIS — M722 Plantar fascial fibromatosis: Secondary | ICD-10-CM | POA: Diagnosis not present

## 2023-08-26 NOTE — Patient Instructions (Signed)

## 2023-08-26 NOTE — Progress Notes (Signed)
  Subjective:  Patient ID: Melissa Schultz, female    DOB: [birth year],   MRN: [id number]  No chief complaint on file.   57 y.o. female presents for concern of a cyst on her left foot  Relates years ago she had the same cyst on her right foot and had it surgically removed. Does not recall exactly what type of lesion she had but it was benign. Relates she doesn't have any direct pain in the area but feels it throws her gait off and starting to get hip and knee pain . Also relates some right heel pain that has started recently with going barefoot and wondering on what to do with this. Denies any other pedal complaints. Denies n/v/f/c.   Past Medical History:  Diagnosis Date   Allergy    Anxiety    on meds   Deviated septum    GERD (gastroesophageal reflux disease)    with certain foods   Hyperlipidemia    on meds   Other thalassemia (HCC) 08/17/2008   Post-operative nausea and vomiting    Seizures (HCC)    2000   Thyroid disease    on meds    Objective:  Physical Exam: Vascular: DP/PT pulses 2/4 bilateral. CFT <3 seconds. Normal hair growth on digits. No edema.  Skin. No lacerations or abrasions bilateral feet. Plantar left first metatarsal head mass about 2 cm with mobility noted. No fluctuance and no pain to palpation.  Musculoskeletal: MMT 5/5 bilateral lower extremities in DF, PF, Inversion and Eversion. Deceased ROM in DF of ankle joint. Tender to right plantar medial tubercle.  Neurological: Sensation intact to light touch.   Assessment:   1. Soft tissue mass   2. Plantar fasciitis, right      Plan:  Patient was evaluated and treated and all questions answered. X-rays reviewed and discussed with patient. No osseous lesions noted.  Discussed benign masses and cysts and treatment options with the patient. Do not feel like aspiration will be possible with lesion given exam and location. Will order MRI for further evaluation of mass and surgical planning.  Discussed  plantar fasciitis and treatment options. Advised on supportive shoes and exercises.  Patient to follow-up  after MRI     Louann Sjogren, DPM

## 2023-08-30 ENCOUNTER — Ambulatory Visit (INDEPENDENT_AMBULATORY_CARE_PROVIDER_SITE_OTHER): Payer: 59 | Admitting: Primary Care

## 2023-08-30 ENCOUNTER — Encounter: Payer: Self-pay | Admitting: Primary Care

## 2023-08-30 ENCOUNTER — Other Ambulatory Visit: Payer: Self-pay | Admitting: Podiatry

## 2023-08-30 VITALS — BP 120/80 | HR 70 | Ht 62.0 in | Wt 129.0 lb

## 2023-08-30 DIAGNOSIS — R0683 Snoring: Secondary | ICD-10-CM

## 2023-08-30 NOTE — Progress Notes (Unsigned)
@Patient  ID: Melissa Schultz, female    DOB: [birth year], 57 y.o.   MRN: [id number]  Chief Complaint  Patient presents with   Consult    Referring provider: Lewis Moccasin, MD  HPI: 57 year old female, never smoked. PMH significant GERD, hyperlipidemia, thalassemia.   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 3 days from home She does not fall asleep is inactive  She does not require naps Denies significant heaches, narcolepsy, catraplxy or sleep walking   Sleep questionnaire Symptoms-   loud snoring, waking up gasping  Prior sleep study- none  Bedtime-9-10pm Time to fall asleep- 15 mins Nocturnal awakenings- 1-2 times  Out of bed/start of day- 6:45am  Weight changes- no Do you operate heavy machinery- no Do you currently wear CPAP- no Do you current wear oxygen- no Epworth- 4  No Known Allergies  Immunization History  Administered Date(s) Administered   Influenza Split 09/19/2011   Influenza Whole 08/19/2007, 08/03/2008, 08/25/2009   Influenza,inj,Quad PF,6+ Mos 08/16/2023   Td 08/17/2008    Past Medical History:  Diagnosis Date   Allergy    Anxiety    on meds   Deviated septum    GERD (gastroesophageal reflux disease)    with certain foods   Hyperlipidemia    on meds   Other thalassemia (HCC) 08/17/2008   Post-operative nausea and vomiting    Seizures (HCC)    2000   Thyroid disease    on meds    Tobacco History: Social History   Tobacco Use  Smoking Status Never  Smokeless Tobacco Never   Counseling given: Not Answered   Outpatient Medications Prior to Visit  Medication Sig Dispense Refill   acetaminophen (TYLENOL) 500 MG tablet Take 500 mg by mouth every 6 (six) hours as needed.     Azelastine  HCl 137 MCG/SPRAY SOLN Place 1 spray into the nose daily as needed.     Cholecalciferol (VITAMIN D3) 5000 units TABS Take 1 tablet by mouth daily.     escitalopram (LEXAPRO) 10 MG tablet Take 10 mg by mouth daily.     Ferrous Sulfate (IRON) 325 (65 Fe) MG TABS Take 1 tablet by mouth daily.     ibuprofen (ADVIL,MOTRIN) 800 MG tablet Take 800 mg by mouth as needed.     loratadine (CLARITIN) 10 MG tablet Take 10 mg by mouth daily as needed.     raloxifene (EVISTA) 60 MG tablet Take 60 mg by mouth daily.     rosuvastatin (CRESTOR) 10 MG tablet Take 10 mg by mouth at bedtime.     SYNTHROID 75 MCG tablet Take 75 mcg by mouth daily.     No facility-administered medications prior to visit.   Review of Systems  Review of Systems  Constitutional: Negative.   HENT: Negative.    Respiratory: Negative.    Cardiovascular: Negative.   Psychiatric/Behavioral:  Positive for sleep disturbance.      Physical Exam  BP 120/80 (BP Location: Left Arm, Patient Position: Sitting, Cuff Size: Normal)   Pulse 70   Ht 5\' 2"  (1.575 m)   Wt 129 lb (58.5 kg)   SpO2 99%   BMI 23.59 kg/m  Physical Exam Constitutional:  Appearance: Normal appearance.  HENT:     Head: Normocephalic and atraumatic.  Cardiovascular:     Rate and Rhythm: Normal rate and regular rhythm.  Pulmonary:     Effort: Pulmonary effort is normal.     Breath sounds: Normal breath sounds.  Neurological:     General: No focal deficit present.     Mental Status: She is alert and oriented to person, place, and time. Mental status is at baseline.  Psychiatric:        Mood and Affect: Mood normal.        Behavior: Behavior normal.        Thought Content: Thought content normal.        Judgment: Judgment normal.      Lab Results:  CBC    Component Value Date/Time   WBC 5.3 11/16/2013 0823   RBC 5.82 (H) 11/16/2013 0823   HGB 10.8 (L) 11/16/2013 0823   HCT 34.0 (L) 11/16/2013 0823   PLT 249.0 11/16/2013 0823   MCV 58.5 (L)  11/16/2013 0823   MCHC 31.9 11/16/2013 0823   RDW 17.0 (H) 11/16/2013 0823   LYMPHSABS 1.2 06/25/2013 0944   MONOABS 0.3 06/25/2013 0944   EOSABS 0.0 06/25/2013 0944   BASOSABS 0.0 06/25/2013 0944    BMET    Component Value Date/Time   NA 138 11/16/2013 0823   K 4.1 11/16/2013 0823   CL 105 11/16/2013 0823   CO2 28 11/16/2013 0823   GLUCOSE 84 11/16/2013 0823   BUN 17 11/16/2013 0823   CREATININE 0.8 11/16/2013 0823   CALCIUM 8.9 11/16/2013 0823   GFRNONAA 83.15 12/14/2009 0756   GFRAA 102 08/10/2008 0856    BNP No results found for: "BNP"  ProBNP No results found for: "PROBNP"  Imaging: No results found.   Assessment & Plan:   No problem-specific Assessment & Plan notes found for this encounter.  Sleep apnea is defined as period of 10 seconds or longer when you stop breathing at night. This can happen multiple times a night. Dx sleep apnea is when this occurs more than 5 times an hour.    Mild OSA 5-15 apneic events an hour Moderate OSA 15-30 apneic events an hour Severe OSA > 30 apneic events an hour   Untreated sleep apnea puts you at higher risk for cardiac arrhythmias, pulmonary HTN, stroke and diabetes  Treatment options include weight loss, side sleeping position, oral appliance, CPAP therapy or referral to ENT for possible surgical options    Recommendations: Focus on side sleeping position or elevate head with wedge pillow 30 degrees Work on weight loss efforts if able  Do not drive if experiencing excessive daytime sleepiness of fatigue    Orders: Home sleep study re: loud snoring (ordered)   Follow-up: Please call to schedule follow-up 1-2 weeks after completing home sleep study to review results and treatment if needed (can be virtual)     Glenford Bayley, NP 08/30/2023

## 2023-08-30 NOTE — Patient Instructions (Addendum)
Sleep apnea is defined as period of 10 seconds or longer when you stop breathing at night. This can happen multiple times a night. Dx sleep apnea is when this occurs more than 5 times an hour.    Mild OSA 5-15 apneic events an hour Moderate OSA 15-30 apneic events an hour Severe OSA > 30 apneic events an hour   Untreated sleep apnea puts you at higher risk for cardiac arrhythmias, pulmonary HTN, stroke and diabetes  Treatment options include weight loss, side sleeping position, oral appliance, CPAP therapy or referral to ENT for possible surgical options    Recommendations: Focus on side sleeping position or elevate head with wedge pillow 30 degrees Work on weight loss efforts if able  Do not drive if experiencing excessive daytime sleepiness of fatigue    Orders: Home sleep study re: loud snoring (ordered)    Follow-up: Please call to schedule follow-up 1-2 weeks after completing home sleep study to review results and treatment if needed (can be virtual)   Sleep Apnea  Sleep apnea is a condition that affects your breathing while you are sleeping. Your tongue or soft tissue in your throat may block the flow of air while you sleep. You may have shallow breathing or stop breathing for short periods of time. People with sleep apnea may snore loudly. There are three kinds of sleep apnea: Obstructive sleep apnea. This kind is caused by a blocked or collapsed airway. This is the most common. Central sleep apnea. This kind happens when the part of the brain that controls breathing does not send the correct signals to the muscles that control breathing. Mixed sleep apnea. This is a combination of obstructive and central sleep apnea. What are the causes? The most common cause of sleep apnea is a collapsed or blocked airway. What increases the risk? Being very overweight. Having family members with sleep apnea. Having a tongue or tonsils that are larger than normal. Having a small airway  or jaw problems. Being older. What are the signs or symptoms? Loud snoring. Restless sleep. Trouble staying asleep. Being sleepy or tired during the day. Waking up gasping or choking. Having a headache in the morning. Mood swings. Having a hard time remembering things and concentrating. How is this diagnosed? A medical history. A physical exam. A sleep study. This is also called a polysomnography test. This test is done at a sleep lab or in your home while you are sleeping. How is this treated? Treatment may include: Sleeping on your side. Losing weight if you're overweight. Wearing an oral appliance. This is a mouthpiece that moves your lower jaw forward. Using a positive airway pressure (PAP) device to keep your airways open while you sleep, such as: A continuous positive airway pressure (CPAP) device. This device gives forced air through a mask when you breathe out. This keeps your airways open. A bilevel positive airway pressure (BIPAP) device. This device gives forced air through a mask when you breathe in and when you breathe out to keep your airways open. Having surgery if other treatments do not work. If your sleep apnea is not treated, you may be at risk for: Heart failure. Heart attack. Stroke. Type 2 diabetes or a problem with your blood sugar called insulin resistance. Follow these instructions at home: Medicines Take your medicines only as told by your health care provider. Avoid alcohol, medicines to help you relax, and certain pain medicines. These may make sleep apnea worse. General instructions Do not smoke, vape, or  use products with nicotine or tobacco in them. If you need help quitting, talk with your provider. If you were given a PAP device to open your airway while you sleep, use it as told by your provider. If you're having surgery, make sure to tell your provider you have sleep apnea. You may need to bring your PAP device with you. Contact a health care  provider if: The PAP device that you were given to use during sleep bothers you or does not seem to be working. You do not feel better or you feel worse. Get help right away if: You have trouble breathing. You have chest pain. You have trouble talking. One side of your body feels weak. A part of your face is hanging down. These symptoms may be an emergency. Call 911 right away. Do not wait to see if the symptoms will go away. Do not drive yourself to the hospital. This information is not intended to replace advice given to you by your health care provider. Make sure you discuss any questions you have with your health care provider. Document Revised: 01/03/2023 Document Reviewed: 01/03/2023 Elsevier Patient Education  2024 [employer].

## 2023-09-03 ENCOUNTER — Encounter: Payer: Self-pay | Admitting: Podiatry

## 2023-09-06 ENCOUNTER — Ambulatory Visit
Admission: RE | Admit: 2023-09-06 | Discharge: 2023-09-06 | Disposition: A | Discharge status: Home / Self Care | Payer: 59 | Source: Ambulatory Visit | Attending: Podiatry

## 2023-09-06 DIAGNOSIS — M7989 Other specified soft tissue disorders: Secondary | ICD-10-CM

## 2023-09-12 DIAGNOSIS — R0683 Snoring: Secondary | ICD-10-CM

## 2023-09-19 ENCOUNTER — Telehealth: Payer: Self-pay | Admitting: Podiatry

## 2023-09-19 NOTE — Telephone Encounter (Signed)
Pt called checking on mri results.   I did explain that they have been taking longer than usual to get the results back( 3 to 4 wks) please advise

## 2023-10-15 ENCOUNTER — Telehealth: Payer: Self-pay | Admitting: Primary Care

## 2023-10-15 ENCOUNTER — Telehealth: Payer: Self-pay

## 2023-10-15 NOTE — Telephone Encounter (Signed)
Patient would like results of home sleep study. Patient phone number is [phone].

## 2023-10-15 NOTE — Telephone Encounter (Signed)
Patient called, asking about her MRI results - she can see in My Chart. Wants to know what this means and what are her next steps [phone]

## 2023-10-16 NOTE — Telephone Encounter (Signed)
I do not see results for hst, will check with [facility]

## 2023-10-17 NOTE — Telephone Encounter (Signed)
Results have been scanned into pts charts, we did go over results and pt was made a f/u ov nfn

## 2023-10-22 ENCOUNTER — Ambulatory Visit: Payer: 59 | Admitting: Podiatry

## 2023-10-28 ENCOUNTER — Encounter: Payer: Self-pay | Admitting: Podiatry

## 2023-10-28 ENCOUNTER — Ambulatory Visit (INDEPENDENT_AMBULATORY_CARE_PROVIDER_SITE_OTHER): Payer: 59 | Admitting: Podiatry

## 2023-10-28 DIAGNOSIS — M7989 Other specified soft tissue disorders: Secondary | ICD-10-CM

## 2023-10-28 NOTE — Progress Notes (Signed)
  Subjective:  Patient ID: Melissa Schultz, female    DOB: 03-22-1966,   MRN: [id number]  Chief Complaint  Patient presents with   Plantar Fasciitis    Pt presents for pf follow up and MRI results.    57 y.o. female presents for concern of a cyst on her left foot Has had MRI and here to review results. Does relates it has been causing more pain and throwing off her gait. Has had previous surgery for the same thing on the other foot.  Denies any other pedal complaints. Denies n/v/f/c.   Past Medical History:  Diagnosis Date   Allergy    Anxiety    on meds   Deviated septum    GERD (gastroesophageal reflux disease)    with certain foods   Hyperlipidemia    on meds   Other thalassemia (HCC) 08/17/2008   Post-operative nausea and vomiting    Seizures (HCC)    2000   Thyroid disease    on meds    Objective:  Physical Exam: Vascular: DP/PT pulses 2/4 bilateral. CFT <3 seconds. Normal hair growth on digits. No edema.  Skin. No lacerations or abrasions bilateral feet. Plantar left first metatarsal head mass about 2 cm with mobility noted. No fluctuance and no pain to palpation.  Musculoskeletal: MMT 5/5 bilateral lower extremities in DF, PF, Inversion and Eversion. Deceased ROM in DF of ankle joint. Tender to right plantar medial tubercle.  Neurological: Sensation intact to light touch.   MRI left foot  IMPRESSION: 1. A 2.3 x 2.4 x 1.2 cm thick-walled fluid collection along the plantar aspect of the first MTP joint most consistent with chronic adventitial bursitis. 2. Bipartite medial hallux sesamoid with marrow edema within the distal fragment as can be seen with sesamoiditis.  Assessment:   1. Soft tissue mass       Plan:  Patient was evaluated and treated and all questions answered. X-rays reviewed and discussed with patient. No osseous lesions noted.  Discussed benign masses and cysts and treatment options with the patient. MRI reviewed with patient and appears to  be a adventitial bursa. This has been causing pain and gait changes and discussed with patient surgical removal to aid with this. She is in agreement.  -Informed surgical risk consent was reviewed and read aloud to the patient.  I reviewed the films.  I have discussed my findings with the patient in great detail.  I have discussed all risks including but not limited to infection, stiffness, scarring, limp, disability, deformity, damage to blood vessels and nerves, numbness, poor healing, need for braces, arthritis, chronic pain, amputation, death.  All benefits and realistic expectations discussed in great detail.  I have made no promises as to the outcome.  I have provided realistic expectations.  I have offered the patient a 2nd opinion, which they have declined and assured me they preferred to proceed despite the risks. Will plan for surgery 2/11.     Louann Sjogren, DPM

## 2023-11-22 ENCOUNTER — Telehealth: Payer: Self-pay | Admitting: Podiatry

## 2023-11-22 NOTE — Telephone Encounter (Signed)
 DOS-12/24/23  EXC GANGLION [id number]  [employer] EFFECTIVE DATE-01/21/17  DEDUCTIBLE- $0.00 WITH REMAINING $0.00 OOP-$4000.00 WITH REMAINING $3,978.30  COINSURANCE- 10%  SPOKE WITH LIMA T FROM [employer] AND HE STATED THAT PRIOR AUTH IS NOT REQUIRED FOR CPT CODE 71909.  CALL REF #: [id number]

## 2023-11-29 ENCOUNTER — Encounter: Payer: Self-pay | Admitting: Primary Care

## 2023-11-29 ENCOUNTER — Telehealth: Payer: 59 | Admitting: Primary Care

## 2023-11-29 DIAGNOSIS — G4733 Obstructive sleep apnea (adult) (pediatric): Secondary | ICD-10-CM

## 2023-11-29 DIAGNOSIS — G4730 Sleep apnea, unspecified: Secondary | ICD-10-CM

## 2023-11-29 HISTORY — DX: Sleep apnea, unspecified: G47.30

## 2023-11-29 NOTE — Patient Instructions (Addendum)
Home sleep study on 09/12/2023 showed moderate obstructive sleep apnea, AHI 15.7 (apneas) an hour with SpO2 low 86%.  Patient spent 0.5 minutes with an oxygen level less than 88%.  Refer: Orthodontics for oral appliance Dr. Irene Limbo DDS [phone] Or  Althea Grimmer DDS [phone]  Sleep Apnea  Sleep apnea is a condition that affects your breathing while you are sleeping. Your tongue or soft tissue in your throat may block the flow of air while you sleep. You may have shallow breathing or stop breathing for short periods of time. People with sleep apnea may snore loudly. There are three kinds of sleep apnea: Obstructive sleep apnea. This kind is caused by a blocked or collapsed airway. This is the most common. Central sleep apnea. This kind happens when the part of the brain that controls breathing does not send the correct signals to the muscles that control breathing. Mixed sleep apnea. This is a combination of obstructive and central sleep apnea. What are the causes? The most common cause of sleep apnea is a collapsed or blocked airway. What increases the risk? Being very overweight. Having family members with sleep apnea. Having a tongue or tonsils that are larger than normal. Having a small airway or jaw problems. Being older. What are the signs or symptoms? Loud snoring. Restless sleep. Trouble staying asleep. Being sleepy or tired during the day. Waking up gasping or choking. Having a headache in the morning. Mood swings. Having a hard time remembering things and concentrating. How is this diagnosed? A medical history. A physical exam. A sleep study. This is also called a polysomnography test. This test is done at a sleep lab or in your home while you are sleeping. How is this treated? Treatment may include: Sleeping on your side. Losing weight if you're overweight. Wearing an oral appliance. This is a mouthpiece that moves your lower jaw forward. Using a positive  airway pressure (PAP) device to keep your airways open while you sleep, such as: A continuous positive airway pressure (CPAP) device. This device gives forced air through a mask when you breathe out. This keeps your airways open. A bilevel positive airway pressure (BIPAP) device. This device gives forced air through a mask when you breathe in and when you breathe out to keep your airways open. Having surgery if other treatments do not work. If your sleep apnea is not treated, you may be at risk for: Heart failure. Heart attack. Stroke. Type 2 diabetes or a problem with your blood sugar called insulin resistance. Follow these instructions at home: Medicines Take your medicines only as told by your health care provider. Avoid alcohol, medicines to help you relax, and certain pain medicines. These may make sleep apnea worse. General instructions Do not smoke, vape, or use products with nicotine or tobacco in them. If you need help quitting, talk with your provider. If you were given a PAP device to open your airway while you sleep, use it as told by your provider. If you're having surgery, make sure to tell your provider you have sleep apnea. You may need to bring your PAP device with you. Contact a health care provider if: The PAP device that you were given to use during sleep bothers you or does not seem to be working. You do not feel better or you feel worse. Get help right away if: You have trouble breathing. You have chest pain. You have trouble talking. One side of your body feels weak. A part of your face  is hanging down. These symptoms may be an emergency. Call 911 right away. Do not wait to see if the symptoms will go away. Do not drive yourself to the hospital. This information is not intended to replace advice given to you by your health care provider. Make sure you discuss any questions you have with your health care provider. Document Revised: 08/01/2023 Document Reviewed:  01/03/2023 Elsevier Patient Education  2024 [employer].

## 2023-11-29 NOTE — Progress Notes (Signed)
Virtual Visit via Video Note  I connected with Melissa Schultz on 11/29/23 at  2:00 PM EST by a video enabled telemedicine application and verified that I am speaking with the correct person using two identifiers.  Location: Patient: Home Provider: Office    I discussed the limitations of evaluation and management by telemedicine and the availability of in person appointments. The patient expressed understanding and agreed to proceed.  History of Present Illness:  58 year old female, never smoked. PMH significant mild OSA, GERD, hyperlipidemia, thalassemia.   Previous [facility] pulmonary encounter: 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 3 days from home She does not fall asleep is inactive  She does not require naps Denies significant heaches, narcolepsy, catraplxy or sleep walking   Sleep questionnaire Symptoms-   loud snoring, waking up gasping  Prior sleep study- none  Bedtime-9-10pm Time to fall asleep- 15 mins Nocturnal awakenings- 1-2 times  Out of bed/start of day- 6:45am  Weight changes- no Do you operate heavy machinery- no Do you currently wear CPAP- no Do you current wear oxygen- no Epworth- 4    11/29/2023- Interim hx Discussed the use of AI scribe software for clinical note transcription with the patient, who gave verbal consent to proceed.  History of Present Illness   Patient presents today to review sleep study results.  Patient was seen for initial sleep consult in October for symptoms of snoring and waking up gasping for air. Home sleep study on 09/12/2023 showed moderate obstructive sleep apnea, AHI 15.7 an hour with SpO2 low 86%.  Patient spent 0.5 minutes with an oxygen level less than 88%.  We reviewed sleep study  results and treatment options. She expresses interest in an oral appliance to alleviate her symptoms, particularly during the night. The patient's sleep apnea score was reported as 15.7, falling on the borderline between mild and moderate categories. She has not reported any significant oxygen desaturations. The patient is aware of the potential costs associated with this treatment option, as it may not be covered under her medical insurance. She has not expressed any interest in surgical options or the use of a CPAP machine at this time.    Observations/Objective:  Appear well without overt respiratory symptoms  Assessment and Plan:  1. Moderate obstructive sleep apnea (Primary) - Ambulatory referral to Orthodontics     Mild to Moderate Sleep Apnea Patient has symptoms of snoring and borderline mild to moderate sleep apnea (15.7 events/hour) without significant oxygen desaturations. Discussed the benefits of an oral appliance vs CPAP -Refer to orthodontic dentist (Dr. Irene Limbo or Dr. Althea Grimmer) for fitting of an oral appliance. -Encourage side sleeping position or elevating head with wedge pillow, avoiding excessive alcohol before bed and advised against driving if experiencing excessive daytime sleepiness   Follow Up Instructions:  -Schedule follow-up in 6 months or sooner if unable to obtain oral appliance.    I discussed the assessment and treatment plan with the patient. The patient was provided an opportunity to ask questions and all were answered. The patient agreed with the plan and demonstrated an understanding of the instructions.   The patient was advised to call back or seek an in-person evaluation if  the symptoms worsen or if the condition fails to improve as anticipated.  I provided 22 minutes of non-face-to-face time during this encounter.   Glenford Bayley, NP

## 2023-12-10 NOTE — Telephone Encounter (Signed)
I have sent all notes, sleep study to Dr. Henrietta Hoover office

## 2023-12-10 NOTE — Telephone Encounter (Signed)
[facility], Please see message regarding need for information regarding referral for patient to schedule an appointment.  Thank you.

## 2023-12-12 NOTE — Telephone Encounter (Signed)
Per Bjorn Loser with Dr. Myrtis Ser office the patient already has an appt scheduled

## 2023-12-24 ENCOUNTER — Other Ambulatory Visit: Payer: Self-pay | Admitting: Podiatry

## 2023-12-24 DIAGNOSIS — M7989 Other specified soft tissue disorders: Secondary | ICD-10-CM | POA: Diagnosis not present

## 2023-12-24 MED ORDER — ONDANSETRON HCL 4 MG PO TABS: 4.0000 mg | ORAL_TABLET | Freq: Three times a day (TID) | ORAL | 0 refills | Status: DC | PRN

## 2023-12-24 MED ORDER — OXYCODONE-ACETAMINOPHEN 5-325 MG PO TABS
1.0000 | ORAL_TABLET | Freq: Four times a day (QID) | ORAL | 0 refills | Status: AC | PRN
Start: 2023-12-24 — End: 2023-12-31

## 2023-12-31 ENCOUNTER — Encounter: Payer: 59 | Admitting: Podiatry

## 2023-12-31 ENCOUNTER — Encounter: Payer: Self-pay | Admitting: Podiatry

## 2024-01-01 ENCOUNTER — Ambulatory Visit (INDEPENDENT_AMBULATORY_CARE_PROVIDER_SITE_OTHER): Payer: 59 | Admitting: Podiatry

## 2024-01-01 ENCOUNTER — Ambulatory Visit (INDEPENDENT_AMBULATORY_CARE_PROVIDER_SITE_OTHER): Payer: 59

## 2024-01-01 ENCOUNTER — Encounter: Payer: Self-pay | Admitting: Podiatry

## 2024-01-01 DIAGNOSIS — M7989 Other specified soft tissue disorders: Secondary | ICD-10-CM

## 2024-01-01 NOTE — Progress Notes (Signed)
  Subjective:  Patient ID: Melissa Schultz, female    DOB: 16-Jun-1966,  MRN: [id number]  Chief Complaint  Patient presents with   Routine Post Op    DOS: 12/24/23  Procedure: Left soft tissue mass removal  58 y.o. female returns for POV#1. Relates doing well and managing pain  Review of Systems: Negative except as noted in the HPI. Denies N/V/F/Ch.  Past Medical History:  Diagnosis Date   Allergy    Anxiety    on meds   Deviated septum    GERD (gastroesophageal reflux disease)    with certain foods   Hyperlipidemia    on meds   Mild sleep apnea 11/29/2023   Other thalassemia (HCC) 08/17/2008   Post-operative nausea and vomiting    Seizures (HCC)    2000   Thyroid disease    on meds    Current Outpatient Medications:    acetaminophen (TYLENOL) 500 MG tablet, Take 500 mg by mouth every 6 (six) hours as needed., Disp: , Rfl:    Azelastine HCl 137 MCG/SPRAY SOLN, Place 1 spray into the nose daily as needed., Disp: , Rfl:    Cholecalciferol (VITAMIN D3) 5000 units TABS, Take 1 tablet by mouth daily., Disp: , Rfl:    escitalopram (LEXAPRO) 10 MG tablet, Take 10 mg by mouth daily., Disp: , Rfl:    Ferrous Sulfate (IRON) 325 (65 Fe) MG TABS, Take 1 tablet by mouth daily., Disp: , Rfl:    ibuprofen (ADVIL,MOTRIN) 800 MG tablet, Take 800 mg by mouth as needed., Disp: , Rfl:    loratadine (CLARITIN) 10 MG tablet, Take 10 mg by mouth daily as needed., Disp: , Rfl:    ondansetron (ZOFRAN) 4 MG tablet, Take 1 tablet (4 mg total) by mouth every 8 (eight) hours as needed for nausea or vomiting., Disp: 20 tablet, Rfl: 0   raloxifene (EVISTA) 60 MG tablet, Take 60 mg by mouth daily., Disp: , Rfl:    rosuvastatin (CRESTOR) 10 MG tablet, Take 10 mg by mouth at bedtime., Disp: , Rfl:    SYNTHROID 75 MCG tablet, Take 75 mcg by mouth daily., Disp: , Rfl:   Social History   Tobacco Use  Smoking Status Never  Smokeless Tobacco Never    No Known Allergies Objective:  There were no  vitals filed for this visit. There is no height or weight on file to calculate BMI. Constitutional Well developed. Well nourished.  Vascular Foot warm and well perfused. Capillary refill normal to all digits.   Neurologic Normal speech. Oriented to person, place, and time. Epicritic sensation to light touch grossly present bilaterally.  Dermatologic Skin healing well without signs of infection. Skin edges well coapted without signs of infection.  Orthopedic: Tenderness to palpation noted about the surgical site.   Radiographs: No interval changes in radiographs  Pathology report  Soft tissue mass, necrobiotic granulomas  No crystalline deposits of malignancy identified   The differential diagnosis includes rheumatoid nodules and necrobiosis lipoidica.   Assessment:   1. Soft tissue mass    Plan:  Patient was evaluated and treated and all questions answered.  S/p foot surgery left -Progressing as expected post-operatively. -WB Status: WBAT in surgical shoe -Sutures: intact. -Medications: n/a -Foot redressed.  Return in 2 weeks for suture removal.   No follow-ups on file.

## 2024-01-14 ENCOUNTER — Ambulatory Visit (INDEPENDENT_AMBULATORY_CARE_PROVIDER_SITE_OTHER): Payer: 59 | Admitting: Podiatry

## 2024-01-14 DIAGNOSIS — M7989 Other specified soft tissue disorders: Secondary | ICD-10-CM

## 2024-01-14 NOTE — Progress Notes (Signed)
  Subjective:  Patient ID: Melissa Schultz, female    DOB: 29-Jan-1966,  MRN: [id number]  No chief complaint on file.   DOS: 12/24/23  Procedure: Left soft tissue mass removal  58 y.o. female returns for POV#2. Relates doing well and managing pain  Review of Systems: Negative except as noted in the HPI. Denies N/V/F/Ch.  Past Medical History:  Diagnosis Date   Allergy    Anxiety    on meds   Deviated septum    GERD (gastroesophageal reflux disease)    with certain foods   Hyperlipidemia    on meds   Mild sleep apnea 11/29/2023   Other thalassemia (HCC) 08/17/2008   Post-operative nausea and vomiting    Seizures (HCC)    2000   Thyroid disease    on meds    Current Outpatient Medications:    acetaminophen (TYLENOL) 500 MG tablet, Take 500 mg by mouth every 6 (six) hours as needed., Disp: , Rfl:    Azelastine HCl 137 MCG/SPRAY SOLN, Place 1 spray into the nose daily as needed., Disp: , Rfl:    Cholecalciferol (VITAMIN D3) 5000 units TABS, Take 1 tablet by mouth daily., Disp: , Rfl:    escitalopram (LEXAPRO) 10 MG tablet, Take 10 mg by mouth daily., Disp: , Rfl:    Ferrous Sulfate (IRON) 325 (65 Fe) MG TABS, Take 1 tablet by mouth daily., Disp: , Rfl:    ibuprofen (ADVIL,MOTRIN) 800 MG tablet, Take 800 mg by mouth as needed., Disp: , Rfl:    loratadine (CLARITIN) 10 MG tablet, Take 10 mg by mouth daily as needed., Disp: , Rfl:    ondansetron (ZOFRAN) 4 MG tablet, Take 1 tablet (4 mg total) by mouth every 8 (eight) hours as needed for nausea or vomiting., Disp: 20 tablet, Rfl: 0   raloxifene (EVISTA) 60 MG tablet, Take 60 mg by mouth daily., Disp: , Rfl:    rosuvastatin (CRESTOR) 10 MG tablet, Take 10 mg by mouth at bedtime., Disp: , Rfl:    SYNTHROID 75 MCG tablet, Take 75 mcg by mouth daily., Disp: , Rfl:   Social History   Tobacco Use  Smoking Status Never  Smokeless Tobacco Never    No Known Allergies Objective:  There were no vitals filed for this visit. There  is no height or weight on file to calculate BMI. Constitutional Well developed. Well nourished.  Vascular Foot warm and well perfused. Capillary refill normal to all digits.   Neurologic Normal speech. Oriented to person, place, and time. Epicritic sensation to light touch grossly present bilaterally.  Dermatologic Skin healing well without signs of infection. Skin edges well coapted without signs of infection.  Orthopedic: Tenderness to palpation noted about the surgical site.   Radiographs: No interval changes in radiographs  Pathology report  Soft tissue mass, necrobiotic granulomas  No crystalline deposits of malignancy identified   The differential diagnosis includes rheumatoid nodules and necrobiosis lipoidica.   Assessment:   No diagnosis found.  Plan:  Patient was evaluated and treated and all questions answered.  S/p foot surgery left -Progressing as expected post-operatively. -WB Status: WBAT in surgical shoe may transition to regular shoe.  -Sutures: removed without incident.  -Medications: n/a -Foot redressed.  Return in 3 weeks for recheck   No follow-ups on file.

## 2024-02-04 ENCOUNTER — Ambulatory Visit (INDEPENDENT_AMBULATORY_CARE_PROVIDER_SITE_OTHER)

## 2024-02-04 ENCOUNTER — Ambulatory Visit (INDEPENDENT_AMBULATORY_CARE_PROVIDER_SITE_OTHER): Admitting: Podiatry

## 2024-02-04 ENCOUNTER — Encounter: Payer: Self-pay | Admitting: Podiatry

## 2024-02-04 DIAGNOSIS — M7989 Other specified soft tissue disorders: Secondary | ICD-10-CM

## 2024-02-04 NOTE — Progress Notes (Signed)
  Subjective:  Patient ID: Melissa Schultz, female    DOB: 31-Jan-1966,  MRN: [id number]  No chief complaint on file.   DOS: 12/24/23  Procedure: Left soft tissue mass removal  58 y.o. female returns for POV#3. Relates doing well and managing pain  Review of Systems: Negative except as noted in the HPI. Denies N/V/F/Ch.  Past Medical History:  Diagnosis Date   Allergy    Anxiety    on meds   Deviated septum    GERD (gastroesophageal reflux disease)    with certain foods   Hyperlipidemia    on meds   Mild sleep apnea 11/29/2023   Other thalassemia (HCC) 08/17/2008   Post-operative nausea and vomiting    Seizures (HCC)    2000   Thyroid disease    on meds    Current Outpatient Medications:    acetaminophen (TYLENOL) 500 MG tablet, Take 500 mg by mouth every 6 (six) hours as needed., Disp: , Rfl:    Azelastine HCl 137 MCG/SPRAY SOLN, Place 1 spray into the nose daily as needed., Disp: , Rfl:    Cholecalciferol (VITAMIN D3) 5000 units TABS, Take 1 tablet by mouth daily., Disp: , Rfl:    escitalopram (LEXAPRO) 10 MG tablet, Take 10 mg by mouth daily., Disp: , Rfl:    Ferrous Sulfate (IRON) 325 (65 Fe) MG TABS, Take 1 tablet by mouth daily., Disp: , Rfl:    ibuprofen (ADVIL,MOTRIN) 800 MG tablet, Take 800 mg by mouth as needed., Disp: , Rfl:    loratadine (CLARITIN) 10 MG tablet, Take 10 mg by mouth daily as needed., Disp: , Rfl:    ondansetron (ZOFRAN) 4 MG tablet, Take 1 tablet (4 mg total) by mouth every 8 (eight) hours as needed for nausea or vomiting., Disp: 20 tablet, Rfl: 0   raloxifene (EVISTA) 60 MG tablet, Take 60 mg by mouth daily., Disp: , Rfl:    rosuvastatin (CRESTOR) 10 MG tablet, Take 10 mg by mouth at bedtime., Disp: , Rfl:    SYNTHROID 75 MCG tablet, Take 75 mcg by mouth daily., Disp: , Rfl:   Social History   Tobacco Use  Smoking Status Never  Smokeless Tobacco Never    No Known Allergies Objective:  There were no vitals filed for this visit. There  is no height or weight on file to calculate BMI. Constitutional Well developed. Well nourished.  Vascular Foot warm and well perfused. Capillary refill normal to all digits.   Neurologic Normal speech. Oriented to person, place, and time. Epicritic sensation to light touch grossly present bilaterally.  Dermatologic Skin healing well without signs of infection. Skin edges well coapted without signs of infection.  Orthopedic: Tenderness to palpation noted about the surgical site.   Radiographs: No interval changes in radiographs  Pathology report  Soft tissue mass, necrobiotic granulomas  No crystalline deposits of malignancy identified   The differential diagnosis includes rheumatoid nodules and necrobiosis lipoidica.   Assessment:   1. Soft tissue mass     Plan:  Patient was evaluated and treated and all questions answered.  S/p foot surgery left -Progressing as expected post-operatively. -WB Status: WBAT in regular shoe -Medications: n/a  Patient discharged from surgical standpoint.   No follow-ups on file.

## 2024-07-05 ENCOUNTER — Encounter (HOSPITAL_BASED_OUTPATIENT_CLINIC_OR_DEPARTMENT_OTHER): Payer: Self-pay

## 2024-07-05 ENCOUNTER — Emergency Department (HOSPITAL_BASED_OUTPATIENT_CLINIC_OR_DEPARTMENT_OTHER)
Admission: EM | Admit: 2024-07-05 | Discharge: 2024-07-05 | Disposition: A | Discharge status: Home / Self Care | Attending: Emergency Medicine | Admitting: Emergency Medicine

## 2024-07-05 ENCOUNTER — Emergency Department (HOSPITAL_BASED_OUTPATIENT_CLINIC_OR_DEPARTMENT_OTHER)

## 2024-07-05 ENCOUNTER — Emergency Department (HOSPITAL_BASED_OUTPATIENT_CLINIC_OR_DEPARTMENT_OTHER): Admitting: Radiology

## 2024-07-05 DIAGNOSIS — I82452 Acute embolism and thrombosis of left peroneal vein: Secondary | ICD-10-CM | POA: Diagnosis not present

## 2024-07-05 DIAGNOSIS — M25562 Pain in left knee: Secondary | ICD-10-CM | POA: Diagnosis present

## 2024-07-05 LAB — CBC WITH DIFFERENTIAL/PLATELET
Abs Immature Granulocytes: 0.01 K/uL (ref 0.00–0.07)
Basophils Absolute: 0 K/uL (ref 0.0–0.1)
Basophils Relative: 0 %
Eosinophils Absolute: 0 K/uL (ref 0.0–0.5)
Eosinophils Relative: 0 %
HCT: 33.7 % — ABNORMAL LOW (ref 36.0–46.0)
Hemoglobin: 10.6 g/dL — ABNORMAL LOW (ref 12.0–15.0)
Immature Granulocytes: 0 %
Lymphocytes Relative: 18 %
Lymphs Abs: 1.1 K/uL (ref 0.7–4.0)
MCH: 18.2 pg — ABNORMAL LOW (ref 26.0–34.0)
MCHC: 31.5 g/dL (ref 30.0–36.0)
MCV: 57.7 fL — ABNORMAL LOW (ref 80.0–100.0)
Monocytes Absolute: 0.4 K/uL (ref 0.1–1.0)
Monocytes Relative: 6 %
Neutro Abs: 4.4 K/uL (ref 1.7–7.7)
Neutrophils Relative %: 76 %
Platelets: 221 K/uL (ref 150–400)
RBC: 5.84 MIL/uL — ABNORMAL HIGH (ref 3.87–5.11)
RDW: 18 % — ABNORMAL HIGH (ref 11.5–15.5)
WBC: 5.8 K/uL (ref 4.0–10.5)
nRBC: 0 % (ref 0.0–0.2)

## 2024-07-05 LAB — BASIC METABOLIC PANEL WITH GFR
Anion gap: 11 (ref 5–15)
BUN: 16 mg/dL (ref 6–20)
CO2: 23 mmol/L (ref 22–32)
Calcium: 9.9 mg/dL (ref 8.9–10.3)
Chloride: 105 mmol/L (ref 98–111)
Creatinine, Ser: 0.87 mg/dL (ref 0.44–1.00)
GFR, Estimated: >60 mL/min (ref >60)
Glucose, Bld: 89 mg/dL (ref 70–99)
Potassium: 4.1 mmol/L (ref 3.5–5.1)
Sodium: 139 mmol/L (ref 135–145)

## 2024-07-05 MED ORDER — APIXABAN (ELIQUIS) EDUCATION KIT FOR DVT/PE PATIENTS: PACK | Freq: Once | Status: AC

## 2024-07-05 MED ORDER — APIXABAN (ELIQUIS) VTE STARTER PACK (10MG AND 5MG)
ORAL_TABLET | ORAL | 0 refills | Status: DC
Start: 2024-07-05 — End: 2024-08-17

## 2024-07-05 MED ORDER — APIXABAN 2.5 MG PO TABS
10.0000 mg | ORAL_TABLET | Freq: Once | ORAL | Status: AC
  Administered 2024-07-05: 10 mg via ORAL
  Filled 2024-07-05: qty 4

## 2024-07-05 MED ORDER — APIXABAN 5 MG PO TABS: 10.0000 mg | ORAL_TABLET | Freq: Two times a day (BID) | ORAL | 0 refills | Status: AC

## 2024-07-05 MED ORDER — KETOROLAC TROMETHAMINE 15 MG/ML IJ SOLN
15.0000 mg | Freq: Once | INTRAMUSCULAR | Status: AC
  Administered 2024-07-05: 15 mg via INTRAMUSCULAR
  Filled 2024-07-05: qty 1

## 2024-07-05 MED ORDER — OXYCODONE HCL 5 MG PO TABS
5.0000 mg | ORAL_TABLET | Freq: Once | ORAL | Status: DC
  Filled 2024-07-05: qty 1

## 2024-07-05 MED ORDER — ACETAMINOPHEN 500 MG PO TABS
1000.0000 mg | ORAL_TABLET | Freq: Once | ORAL | Status: AC
  Administered 2024-07-05: 1000 mg via ORAL
  Filled 2024-07-05: qty 2

## 2024-07-05 NOTE — Discharge Instructions (Signed)
 Your ultrasound showed a blood clot in your leg.  I have started you on a blood thinning medication.  As we had discussed please return for bleeding especially if you have dark stool or blood in your stool or have a sudden severe headache.   You should try not to take medicines like ibuprofen or naproxen while you are on this medication.  Your doctor typically will have you on this medication for 3 to 6 months.  Use your voltaren gel regularly for at least the next week. Also take tylenol  1000mg (2 extra strength) four times a day.

## 2024-07-05 NOTE — ED Triage Notes (Signed)
 She reports feeling a pop at post. Left knee yesterday evening during normal activity. She is here with persistent pain at post. Left knee. No edema or discoloration of knee present.

## 2024-07-05 NOTE — ED Provider Notes (Signed)
 [place] EMERGENCY DEPARTMENT AT [facility] Provider Note   CSN: [id number] Arrival date & time: 07/05/24  9187     Patient presents with: Knee Pain   Melissa Schultz is a 58 y.o. female.   58 yo F with a chief complaint of left knee pain.  This has been going on for maybe a month or 2 now but had sudden pop yesterday evening and has had  worse pain.  Pain seems to be worse when she tries to extend the leg.  Mostly in the back of the knee but also feels pain to the lateral aspect of the hip and sometimes in the groin.  Denies trauma.  Denies back pain.  Denies fevers.  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  She thought her knee pain initially was due to her favoring her foot.   Knee Pain      Prior to Admission medications   Medication Sig Start Date End Date Taking? Authorizing Provider  APIXABAN  (ELIQUIS ) VTE STARTER PACK (10MG  AND 5MG ) Take as directed on package: start with two-5mg  tablets twice daily for 7 days. On day 8, switch to one-5mg  tablet twice daily. 07/05/24  Yes Emil Share, DO  acetaminophen  (TYLENOL ) 500 MG tablet Take 500 mg by mouth every 6 (six) hours as needed.    [provider]  Azelastine HCl 137 MCG/SPRAY SOLN Place 1 spray into the nose daily as needed.    [provider]  Cholecalciferol (VITAMIN D3) 5000 units TABS Take 1 tablet by mouth daily.    [provider]  escitalopram (LEXAPRO) 10 MG tablet Take 10 mg by mouth daily. 06/04/22   [provider]  Ferrous Sulfate (IRON) 325 (65 Fe) MG TABS Take 1 tablet by mouth daily.    [provider]  ibuprofen (ADVIL,MOTRIN) 800 MG tablet Take 800 mg by mouth as needed. 03/22/14   [provider]  loratadine (CLARITIN) 10 MG tablet Take 10 mg by mouth daily as needed.    [provider]  ondansetron  (ZOFRAN ) 4 MG tablet Take 1 tablet (4 mg total) by mouth every 8 (eight) hours as  needed for nausea or vomiting. 12/24/23   Joya Stabs, DPM  raloxifene (EVISTA) 60 MG tablet Take 60 mg by mouth daily. 06/12/22   [provider]  rosuvastatin (CRESTOR) 10 MG tablet Take 10 mg by mouth at bedtime. 02/10/22   [provider]  SYNTHROID 75 MCG tablet Take 75 mcg by mouth daily. 04/17/22   [provider]    Allergies: Patient has no known allergies.    Review of Systems  Updated Vital Signs BP 125/85 (BP Location: Right Arm)   Pulse (!) 59   Temp 98.4 F (36.9 C) (Oral)   Resp 16   SpO2 100%   Physical Exam Vitals and nursing note reviewed.  Constitutional:      General: She is not in acute distress.    Appearance: She is well-developed. She is not diaphoretic.  HENT:     Head: Normocephalic and atraumatic.  Eyes:     Pupils: Pupils are equal, round, and reactive to light.  Cardiovascular:     Rate and Rhythm: Normal rate and regular rhythm.     Heart sounds: No murmur heard.    No friction rub. No gallop.  Pulmonary:     Effort: Pulmonary effort is normal.     Breath sounds: No  wheezing or rales.  Abdominal:     General: There is no distension.     Palpations: Abdomen is soft.     Tenderness: There is no abdominal tenderness.  Musculoskeletal:        General: No tenderness.     Cervical back: Normal range of motion and neck supple.     Comments: Pulse motor and sensation intact to the left lower extremity.  Able to range the knee without significant discomfort.  No edema.  No ligamentous laxity.  Negative McMurray's.  No obvious pain at the attachment of the hip adductor to the pubic symphysis.  She points to the piriformis as area pain but not reproduced with palpation.  She also has some pain to the greater trochanter though not obviously reproduced on exam.  Skin:    General: Skin is warm and dry.  Neurological:     Mental Status: She is alert and oriented to person, place, and time.  Psychiatric:        Behavior: Behavior  normal.     (all labs ordered are listed, but only abnormal results are displayed) Labs Reviewed  CBC WITH DIFFERENTIAL/PLATELET - Abnormal; Notable for the following components:      Result Value   RBC 5.84 (*)    Hemoglobin 10.6 (*)    HCT 33.7 (*)    MCV 57.7 (*)    MCH 18.2 (*)    RDW 18.0 (*)    All other components within normal limits  BASIC METABOLIC PANEL WITH GFR    EKG: None  Radiology: US  Venous Img Lower Unilateral Left Result Date: 07/05/2024 CLINICAL DATA:  Posterior left knee pain EXAM: LEFT LOWER EXTREMITY VENOUS DOPPLER ULTRASOUND TECHNIQUE: Gray-scale sonography with graded compression, as well as color Doppler and duplex ultrasound were performed to evaluate the lower extremity deep venous systems from the level of the common femoral vein and including the common femoral, femoral, profunda femoral, popliteal and calf veins including the posterior tibial, peroneal and gastrocnemius veins when visible. The superficial great saphenous vein was also interrogated. Spectral Doppler was utilized to evaluate flow at rest and with distal augmentation maneuvers in the common femoral, femoral and popliteal veins. COMPARISON:  None Available. FINDINGS: Contralateral Common Femoral Vein: Respiratory phasicity is normal and symmetric with the symptomatic side. No evidence of thrombus. Normal compressibility. Common Femoral Vein: No evidence of thrombus. Normal compressibility, respiratory phasicity and response to augmentation. Saphenofemoral Junction: No evidence of thrombus. Normal compressibility and flow on color Doppler imaging. Profunda Femoral Vein: No evidence of thrombus. Normal compressibility and flow on color Doppler imaging. Femoral Vein: No evidence of thrombus. Normal compressibility, respiratory phasicity and response to augmentation. Popliteal Vein: No evidence of thrombus. Normal compressibility, respiratory phasicity and response to augmentation. Calf Veins: Peroneal  veins demonstrate no evidence of color flow on color Doppler imaging and appear expanded with the lumens filled with internal echogenic material. Findings consistent with isolated calf vein DVT. The posterior tibial veins are patent with normal color flow. Superficial Great Saphenous Vein: No evidence of thrombus. Normal compressibility. Venous Reflux:  None. Other Findings: Ovoid minimally complex fluid collection in the popliteal fossa measures 3.2 x 0.8 x 2.8 cm. IMPRESSION: 1. Positive for isolated calf vein DVT involving the peroneal veins. 2. Mildly complex Baker's cyst. Electronically Signed   By: Wilkie Lent M.D.   On: 07/05/2024 09:53   DG Knee Complete 4 Views Left Result Date: 07/05/2024 CLINICAL DATA:  Knee pain EXAM: LEFT KNEE -  COMPLETE 4+ VIEW COMPARISON:  None Available. FINDINGS: No evidence for an acute fracture, dislocation, or joint effusion. Trace spurring is seen in the lateral and patellofemoral compartments. No worrisome lytic or sclerotic osseous abnormality. IMPRESSION: Mild degenerative changes without acute bony findings. Electronically Signed   By: Camellia Candle M.D.   On: 07/05/2024 09:23     Procedures   Medications Ordered in the ED  oxyCODONE  (Oxy IR/ROXICODONE ) immediate release tablet 5 mg (5 mg Oral Patient Refused/Not Given 07/05/24 0847)  apixaban  (ELIQUIS ) Education Kit for DVT/PE patients (has no administration in time range)  apixaban  (ELIQUIS ) tablet 10 mg (has no administration in time range)  acetaminophen  (TYLENOL ) tablet 1,000 mg (1,000 mg Oral Given 07/05/24 0843)  ketorolac  (TORADOL ) 15 MG/ML injection 15 mg (15 mg Intramuscular Given 07/05/24 0843)                                    Medical Decision Making Amount and/or Complexity of Data Reviewed Labs: ordered. Radiology: ordered.  Risk OTC drugs. Prescription drug management.   58 yo F with a chief complaints of posterior knee pain.  Been going on for some time but got significantly  worse last night when she felt a pop.  Had seen her family doctor recently for this and is being worked up for possible rheumatologic disorder.  Exam is largely benign for me.  Will obtain an x-ray DVT study.  Plain film of the knee independently interpreted by me without fracture.  DVT study is positive for popliteal DVT.  Also small Baker's cyst.  Will start the patient on anticoagulation however I am unable to verify the patient's renal function.  Discussed this with patient who is willing to have her labs checked today.  Patient's renal function is normal.  Will discharge home.  PCP follow-up.  10:41 AM:  I have discussed the diagnosis/risks/treatment options with the patient.  Evaluation and diagnostic testing in the emergency department does not suggest an emergent condition requiring admission or immediate intervention beyond what has been performed at this time.  They will follow up with PCP. We also discussed returning to the ED immediately if new or worsening sx occur. We discussed the sx which are most concerning (e.g., sudden worsening pain, fever, inability to tolerate by mouth) that necessitate immediate return. Medications administered to the patient during their visit and any new prescriptions provided to the patient are listed below.  Medications given during this visit Medications  oxyCODONE  (Oxy IR/ROXICODONE ) immediate release tablet 5 mg (5 mg Oral Patient Refused/Not Given 07/05/24 0847)  apixaban  (ELIQUIS ) Education Kit for DVT/PE patients (has no administration in time range)  apixaban  (ELIQUIS ) tablet 10 mg (has no administration in time range)  acetaminophen  (TYLENOL ) tablet 1,000 mg (1,000 mg Oral Given 07/05/24 0843)  ketorolac  (TORADOL ) 15 MG/ML injection 15 mg (15 mg Intramuscular Given 07/05/24 0843)     The patient appears reasonably screen and/or stabilized for discharge and I doubt any other medical condition or other [facility] requiring further screening, evaluation,  or treatment in the ED at this time prior to discharge.       Final diagnoses:  Acute deep vein thrombosis (DVT) of left peroneal vein [facility])    ED Discharge Orders          Ordered    APIXABAN  (ELIQUIS ) VTE STARTER PACK (10MG  AND 5MG )       Note to Pharmacy: If  starter pack unavailable, substitute with seventy-four 5 mg apixaban  tabs following the above SIG directions.   07/05/24 1039               Emil Share, DO 07/05/24 1041

## 2024-08-17 ENCOUNTER — Inpatient Hospital Stay

## 2024-08-17 ENCOUNTER — Inpatient Hospital Stay: Attending: Hematology and Oncology | Admitting: Hematology and Oncology

## 2024-08-17 ENCOUNTER — Encounter: Payer: Self-pay | Admitting: Hematology and Oncology

## 2024-08-17 VITALS — BP 111/68 | HR 71 | Temp 98.6°F | Resp 18 | Ht 62.0 in | Wt 133.2 lb

## 2024-08-17 DIAGNOSIS — Z7901 Long term (current) use of anticoagulants: Secondary | ICD-10-CM | POA: Insufficient documentation

## 2024-08-17 DIAGNOSIS — D568 Other thalassemias: Secondary | ICD-10-CM | POA: Insufficient documentation

## 2024-08-17 DIAGNOSIS — I824Z2 Acute embolism and thrombosis of unspecified deep veins of left distal lower extremity: Secondary | ICD-10-CM | POA: Diagnosis present

## 2024-08-17 NOTE — Progress Notes (Signed)
  Cancer Center FOLLOW-UP progress notes  Patient Care Team: Waylan Almarie SAUNDERS, MD as PCP - General (Family Medicine)  CHIEF COMPLAINTS/PURPOSE OF VISIT:  Acute lower extremity DVT, history of thalassemia  HISTORY OF PRESENTING ILLNESS:  Melissa Schultz 58 y.o. female was transferred to my care after her prior physician has left.  She is being referred here as a new patient but she was seen by Dr. Amadeo in 2023 for evaluation of microcytic anemia and was found to have thalassemia The patient had multiple cyst removed from multiple limbs On December 24, 2023, she underwent surgical procedure to remove a cyst from the bottom of her left foot She wore a boot for a while but it did not impair her mobility The patient has been fairly active and typically exercise 3 to 4 days a week She denies recent travel.  She does not smoke. She was placed on raloxifene for almost a year for osteoporosis, that was discontinued after recent diagnosis of blood clot She has never had prior diagnosis of blood clot before She had numerous surgeries in the past as listed; when she was younger, she was placed on birth control pill for some time.  The patient have history of recurrent miscarriages and had been on in vitro fertility treatment for some time.  She subsequently got pregnant without IVF and her son was born with C-section. No family history of thrombotic history  MEDICAL HISTORY:  Past Medical History:  Diagnosis Date   Allergy    Anxiety    on meds   Deviated septum    GERD (gastroesophageal reflux disease)    with certain foods   Hyperlipidemia    on meds   Mild sleep apnea 11/29/2023   Other thalassemia 08/17/2008   Post-operative nausea and vomiting    Seizures (HCC)    2000   Thyroid disease    on meds    SURGICAL HISTORY: Past Surgical History:  Procedure Laterality Date   ABDOMINAL HYSTERECTOMY  2010   CESAREAN SECTION  2003   COLONOSCOPY  2018    DJ-MAC-suprep(exc)-TA   EXPLORATORY LAPAROTOMY  2000   SEPTOPLASTY  2013    SOCIAL HISTORY: Social History   Socioeconomic History   Marital status: Married    Spouse name: Not on file   Number of children: 1   Years of education: Not on file   Highest education level: Not on file  Occupational History   Not on file  Tobacco Use   Smoking status: Never   Smokeless tobacco: Never  Vaping Use   Vaping status: Never Used  Substance and Sexual Activity   Alcohol use: Yes    Alcohol/week: 14.0 standard drinks of alcohol    Types: 14 Glasses of wine per week    Comment: wine daily per pt   Drug use: No   Sexual activity: Yes    Birth control/protection: Post-menopausal    Comment: hysterectomy  Other Topics Concern   Not on file  Social History Narrative   Not on file   Social Drivers of Health   Financial Resource Strain: Not on file  Food Insecurity: No Food Insecurity (08/17/2024)   Hunger Vital Sign    Worried About Running Out of Food in the Last Year: Never true    Ran Out of Food in the Last Year: Never true  Transportation Needs: No Transportation Needs (08/17/2024)   PRAPARE - Administrator, Civil Service (Medical): No  Lack of Transportation (Non-Medical): No  Physical Activity: Not on file  Stress: Not on file  Social Connections: Not on file  Intimate Partner Violence: Not At Risk (08/17/2024)   Humiliation, Afraid, Rape, and Kick questionnaire    Fear of Current or Ex-Partner: No    Emotionally Abused: No    Physically Abused: No    Sexually Abused: No    FAMILY HISTORY: Family History  Problem Relation Age of Onset   Multiple sclerosis Mother    Diabetes Father    Colon cancer Neg Hx    Esophageal cancer Neg Hx    Pancreatic cancer Neg Hx    Rectal cancer Neg Hx    Stomach cancer Neg Hx    Colon polyps Neg Hx     ALLERGIES:  has no known allergies.  MEDICATIONS:  Current Outpatient Medications  Medication Sig Dispense Refill    acetaminophen  (TYLENOL ) 500 MG tablet Take 500 mg by mouth every 6 (six) hours as needed.     apixaban  (ELIQUIS ) 5 MG TABS tablet Take 2 tablets (10 mg total) by mouth 2 (two) times daily for 1 dose. 2 tablet 0   Azelastine HCl 137 MCG/SPRAY SOLN Place 1 spray into the nose daily as needed.     Cholecalciferol (VITAMIN D3) 5000 units TABS Take 1 tablet by mouth daily.     escitalopram (LEXAPRO) 10 MG tablet Take 10 mg by mouth daily.     Ferrous Sulfate (IRON) 325 (65 Fe) MG TABS Take 1 tablet by mouth daily.     loratadine (CLARITIN) 10 MG tablet Take 10 mg by mouth daily as needed.     rosuvastatin (CRESTOR) 10 MG tablet Take 10 mg by mouth at bedtime.     SYNTHROID 75 MCG tablet Take 75 mcg by mouth daily.     No current facility-administered medications for this visit.    REVIEW OF SYSTEMS:   Constitutional: Denies fevers, chills or abnormal night sweats Eyes: Denies blurriness of vision, double vision or watery eyes Ears, nose, mouth, throat, and face: Denies mucositis or sore throat Respiratory: Denies cough, dyspnea or wheezes Cardiovascular: Denies palpitation, chest discomfort or lower extremity swelling Gastrointestinal:  Denies nausea, heartburn or change in bowel habits Skin: Denies abnormal skin rashes Lymphatics: Denies new lymphadenopathy or easy bruising Neurological:Denies numbness, tingling or new weaknesses Behavioral/Psych: Mood is stable, no new changes  All other systems were reviewed with the patient and are negative.  PHYSICAL EXAMINATION: ECOG PERFORMANCE STATUS: 1 - Symptomatic but completely ambulatory  Vitals:   08/17/24 1430  BP: 111/68  Pulse: 71  Resp: 18  Temp: 98.6 F (37 C)  SpO2: 98%   Filed Weights   08/17/24 1430  Weight: 133 lb 3.2 oz (60.4 kg)    GENERAL:alert, no distress and comfortable NEURO: no focal motor/sensory deficits  LABORATORY DATA:  I have reviewed the data as listed Lab Results  Component Value Date   WBC 5.8  07/05/2024   HGB 10.6 (L) 07/05/2024   HCT 33.7 (L) 07/05/2024   MCV 57.7 (L) 07/05/2024   PLT 221 07/05/2024   Recent Labs    07/05/24 0942  NA 139  K 4.1  CL 105  CO2 23  GLUCOSE 89  BUN 16  CREATININE 0.87  CALCIUM 9.9  GFRNONAA >60    ASSESSMENT & PLAN:  Acute deep vein thrombosis (DVT) of distal vein of left lower extremity (HCC) I suspect her recent acute DVT is provoked by either raloxifene or presence  of the complex cyst in the left popliteal fossa She will continue anticoagulation therapy as directed She will complete her 3 months of anticoagulation therapy by November 24 After that, I give the patient instruction to stop Eliquis  and transition to taking 281 mg aspirin tablet She will return the following week for repeat labs including D-dimer test and repeat venous Doppler ultrasound to see if her clot has resolved and whether the complex cyst is still present in the left popliteal fossa  Other thalassemia The patient have thalassemia diagnosis She is taking oral iron supplement I recommend the patient to stop oral iron supplement and I will check her iron studies in her next visit  Orders Placed This Encounter  Procedures   CBC with Differential/Platelet    Standing Status:   Future    Expiration Date:   08/17/2025   Basic Metabolic Panel - Cancer Center Only    Standing Status:   Future    Expiration Date:   08/17/2025   D-dimer, quantitative    Standing Status:   Future    Expiration Date:   08/17/2025   Ferritin    Standing Status:   Future    Expiration Date:   08/17/2025   Iron and Iron Binding Capacity ([facility] only)    Standing Status:   Future    Expiration Date:   08/17/2025    All questions were answered. The patient knows to call the clinic with any problems, questions or concerns. The total time spent in the appointment was 55 minutes encounter with patients including review of chart and various tests results, discussions about plan of care and  coordination of care plan   Almarie Bedford, MD 08/17/2024 2:49 PM

## 2024-08-17 NOTE — Assessment & Plan Note (Signed)
 I suspect her recent acute DVT is provoked by either raloxifene or presence of the complex cyst in the left popliteal fossa She will continue anticoagulation therapy as directed She will complete her 3 months of anticoagulation therapy by November 24 After that, I give the patient instruction to stop Eliquis  and transition to taking 281 mg aspirin tablet She will return the following week for repeat labs including D-dimer test and repeat venous Doppler ultrasound to see if her clot has resolved and whether the complex cyst is still present in the left popliteal fossa

## 2024-08-17 NOTE — Assessment & Plan Note (Signed)
 The patient have thalassemia diagnosis She is taking oral iron supplement I recommend the patient to stop oral iron supplement and I will check her iron studies in her next visit

## 2024-10-12 ENCOUNTER — Inpatient Hospital Stay: Attending: Hematology and Oncology

## 2024-10-12 ENCOUNTER — Ambulatory Visit (HOSPITAL_COMMUNITY)
Admission: RE | Admit: 2024-10-12 | Discharge: 2024-10-12 | Disposition: A | Source: Ambulatory Visit | Attending: Hematology and Oncology

## 2024-10-12 DIAGNOSIS — I824Z2 Acute embolism and thrombosis of unspecified deep veins of left distal lower extremity: Secondary | ICD-10-CM | POA: Insufficient documentation

## 2024-10-12 DIAGNOSIS — Z86718 Personal history of other venous thrombosis and embolism: Secondary | ICD-10-CM | POA: Insufficient documentation

## 2024-10-12 DIAGNOSIS — D568 Other thalassemias: Secondary | ICD-10-CM | POA: Diagnosis not present

## 2024-10-12 LAB — BASIC METABOLIC PANEL - CANCER CENTER ONLY
Anion gap: 10 (ref 5–15)
BUN: 20 mg/dL (ref 6–20)
CO2: 26 mmol/L (ref 22–32)
Calcium: 9.3 mg/dL (ref 8.9–10.3)
Chloride: 105 mmol/L (ref 98–111)
Creatinine: 0.87 mg/dL (ref 0.44–1.00)
GFR, Estimated: >60 mL/min (ref >60)
Glucose, Bld: 105 mg/dL — ABNORMAL HIGH (ref 70–99)
Potassium: 4.3 mmol/L (ref 3.5–5.1)
Sodium: 141 mmol/L (ref 135–145)

## 2024-10-12 LAB — CBC WITH DIFFERENTIAL/PLATELET
Abs Immature Granulocytes: 0.02 K/uL (ref 0.00–0.07)
Basophils Absolute: 0 K/uL (ref 0.0–0.1)
Basophils Relative: 0 %
Eosinophils Absolute: 0.1 K/uL (ref 0.0–0.5)
Eosinophils Relative: 1 %
HCT: 36.3 % (ref 36.0–46.0)
Hemoglobin: 11.4 g/dL — ABNORMAL LOW (ref 12.0–15.0)
Immature Granulocytes: 0 %
Lymphocytes Relative: 17 %
Lymphs Abs: 1.1 K/uL (ref 0.7–4.0)
MCH: 18.4 pg — ABNORMAL LOW (ref 26.0–34.0)
MCHC: 31.4 g/dL (ref 30.0–36.0)
MCV: 58.5 fL — ABNORMAL LOW (ref 80.0–100.0)
Monocytes Absolute: 0.3 K/uL (ref 0.1–1.0)
Monocytes Relative: 5 %
Neutro Abs: 4.9 K/uL (ref 1.7–7.7)
Neutrophils Relative %: 77 %
Platelets: 234 K/uL (ref 150–400)
RBC: 6.21 MIL/uL — ABNORMAL HIGH (ref 3.87–5.11)
RDW: 18.4 % — ABNORMAL HIGH (ref 11.5–15.5)
Smear Review: NORMAL
WBC: 6.5 K/uL (ref 4.0–10.5)
nRBC: 0 % (ref 0.0–0.2)

## 2024-10-12 LAB — IRON AND IRON BINDING CAPACITY (CC-WL,HP ONLY)
Iron: 75 ug/dL (ref 28–170)
Saturation Ratios: 29 % (ref 10.4–31.8)
TIBC: 259 ug/dL (ref 250–450)
UIBC: 184 ug/dL

## 2024-10-12 LAB — D-DIMER, QUANTITATIVE: D-Dimer, Quant: 0.37 ug{FEU}/mL (ref 0.00–0.50)

## 2024-10-12 LAB — FERRITIN: Ferritin: 1225 ng/mL — ABNORMAL HIGH (ref 11–307)

## 2024-10-16 ENCOUNTER — Inpatient Hospital Stay

## 2024-10-16 ENCOUNTER — Other Ambulatory Visit

## 2024-10-16 ENCOUNTER — Encounter: Payer: Self-pay | Admitting: Hematology and Oncology

## 2024-10-16 ENCOUNTER — Inpatient Hospital Stay: Admitting: Hematology and Oncology

## 2024-10-16 VITALS — BP 111/84 | HR 82 | Temp 98.1°F | Resp 18 | Ht 62.0 in | Wt 131.4 lb

## 2024-10-16 DIAGNOSIS — D568 Other thalassemias: Secondary | ICD-10-CM | POA: Diagnosis not present

## 2024-10-16 DIAGNOSIS — Z86718 Personal history of other venous thrombosis and embolism: Secondary | ICD-10-CM | POA: Diagnosis not present

## 2024-10-16 DIAGNOSIS — I824Z2 Acute embolism and thrombosis of unspecified deep veins of left distal lower extremity: Secondary | ICD-10-CM

## 2024-10-16 NOTE — Progress Notes (Signed)
 [facility] OFFICE PROGRESS NOTE  Patient Care Team: Melissa Melissa SAUNDERS, MD as PCP - General (Family Medicine)  Assessment & Plan Acute deep vein thrombosis (DVT) of distal vein of left lower extremity (HCC) I suspect her recent acute DVT is provoked by either raloxifene or presence of the complex cyst in the left popliteal fossa She has completed anticoagulation therapy Recent D-dimer was negative Venous ultrasound showed no residual clot She is still having some achiness when she walks I recommend referral to sports medicine for further evaluation We discussed the role of preventative aspirin at 182 mg Iron overload She will had 1 dose of phlebotomy today I recommend the patient to consider blood donation Other thalassemia The patient have thalassemia diagnosis She was taking oral iron supplement up until 3 months ago Since stopping oral iron supplement, her ferritin level is still very high We discussed the role of phlebotomy and she agrees I plan to see her again in 3 months with repeat labs and iron studies  Orders Placed This Encounter  Procedures   CBC with Differential/Platelet    Standing Status:   Standing    Number of Occurrences:   22    Expiration Date:   10/16/2025   Ferritin    Standing Status:   Standing    Number of Occurrences:   3    Expiration Date:   10/16/2025   Ambulatory referral to Sports Medicine    Referral Priority:   Routine    Referral Type:   Consultation    Referred to Provider:   Claudene Arthea HERO, DO    Number of Visits Requested:   1     Melissa Bedford, MD  INTERVAL HISTORY: she returns for surveillance follow-up for history of DVT/PE and iron overload in the setting of thalassemia Patient denies recent bleeding such as epistaxis, hematuria or hematochezia We reviewed medication list and discussed medication changes We discussed test results and future plan of care as outlined above  PHYSICAL EXAMINATION: ECOG PERFORMANCE  STATUS: 0 - Asymptomatic  Vitals:   10/16/24 0921  BP: 111/84  Pulse: 82  Resp: 18  Temp: 98.1 F (36.7 C)  SpO2: 100%   Lab Results  Component Value Date   WBC 6.5 10/12/2024   HGB 11.4 (L) 10/12/2024   HCT 36.3 10/12/2024   MCV 58.5 (L) 10/12/2024   PLT 234 10/12/2024    SUMMARY OF HEMATOLOGIC HISTORY:  She is being referred here as a new patient but she was seen by Dr. Amadeo in 2023 for evaluation of microcytic anemia and was found to have thalassemia The patient had multiple cyst removed from multiple limbs On December 24, 2023, she underwent surgical procedure to remove a cyst from the bottom of her left foot She wore a boot for a while but it did not impair her mobility The patient has been fairly active and typically exercise 3 to 4 days a week She denies recent travel.  She does not smoke. She was placed on raloxifene for almost a year for osteoporosis, that was discontinued after recent diagnosis of blood clot She has never had prior diagnosis of blood clot before She had numerous surgeries in the past as listed; when she was younger, she was placed on birth control pill for some time.  The patient have history of recurrent miscarriages and had been on in vitro fertility treatment for some time.  She subsequently got pregnant without IVF and her son was born with C-section.  No family history of thrombotic history She completed 3 months of anticoagulation therapy.  Repeat venous Doppler ultrasound in November 2025 showed no evidence of residual clot.  She was noted to have very high ferritin level and had phlebotomy

## 2024-10-16 NOTE — Patient Instructions (Signed)

## 2024-10-16 NOTE — Assessment & Plan Note (Addendum)
 The patient have thalassemia diagnosis She was taking oral iron supplement up until 3 months ago Since stopping oral iron supplement, her ferritin level is still very high We discussed the role of phlebotomy and she agrees I plan to see her again in 3 months with repeat labs and iron studies

## 2024-10-16 NOTE — Assessment & Plan Note (Addendum)
 I suspect her recent acute DVT is provoked by either raloxifene or presence of the complex cyst in the left popliteal fossa She has completed anticoagulation therapy Recent D-dimer was negative Venous ultrasound showed no residual clot She is still having some achiness when she walks I recommend referral to sports medicine for further evaluation We discussed the role of preventative aspirin at 182 mg

## 2024-10-16 NOTE — Assessment & Plan Note (Signed)
 She will had 1 dose of phlebotomy today I recommend the patient to consider blood donation

## 2024-10-16 NOTE — Progress Notes (Signed)
 Melissa Schultz presents today for phlebotomy per MD orders. Phlebotomy procedure started at 1122 and ended at 1130. 485 grams removed. Patient c/o dizziness, phlebotomy stopped early. Patient observed for 30 minutes after procedure without any incident. Patient tolerated procedure well. IV needle removed intact.

## 2024-10-19 ENCOUNTER — Other Ambulatory Visit: Payer: Self-pay | Admitting: Hematology and Oncology

## 2024-10-19 ENCOUNTER — Telehealth: Payer: Self-pay

## 2024-10-19 NOTE — Telephone Encounter (Signed)
 She only need it every 3 months Please add phlebotomy after I see her in march

## 2024-10-19 NOTE — Telephone Encounter (Signed)
 She [facility] around [facility] They will not [facility] her blood due to low hemoglobin.  She is asking for appt for phlebotomy.

## 2024-10-19 NOTE — Telephone Encounter (Signed)
 Called and given below message. Reviewed upcoming appts. She is aware of appt date/time.

## 2024-10-22 NOTE — Progress Notes (Unsigned)
 Ben Jackson D.[facility] [address] [place] [address] Phone: [phone]   Assessment and Plan:     1. Primary osteoarthritis of left knee (Primary) 2. Baker's cyst of knee, left -Chronic with exacerbation, initial sports medicine visit - Most consistent with complex Baker's cyst of left knee causing posterior left knee pain.  Suspect Baker's cyst originally formed with flare of mild osteoarthritis, but patient's symptoms currently are more consistent with symptomatic Baker's cyst. - Patient had peroneal vein DVT found on Doppler ultrasound on 07/01/2024, was treated with Eliquis  x 3 months, and DVT had resolved by 10/12/2024.  Patient has been off of Eliquis  for 3+ weeks - Based on posterior knee pain, continued Baker's cyst that is not resolving with time, relative rest, conservative therapy, posterior knee pain, we elected to drain Baker's cyst at today's visit.  Discussed possibility of recurrence of Baker's cyst.  Procedure tolerated well per note below - Use Tylenol  500 to 1000 mg tablets 2-3 times a day for day-to-day pain relief -May restart all physical activity as tolerated  Procedure: Ultrasound Guided Bakers Cyst Injection/Aspiration Side: Left Diagnosis: Baker's cyst US  Indication:  - accuracy is paramount for diagnosis - to ensure therapeutic efficacy or procedural success - to reduce procedural risk  After explaining the procedure, viable alternatives, risks, and answering any questions, consent was given verbally. The site was cleaned with chlorhexidine prep. An ultrasound transducer was placed on the posterior knee.  The complex and loculated bakers cyst was located between medial gastrocnemius and semimembranosus tendon. Local anesthesia obtained using  1ml of 1% lidocaine without epinephrine.  A needle was introduced to the pouch under ultrasound guidance with sterile technique.   4 ml of fluid from the cyst were removed.   Fluid was straw colored/clear, thickened.  This was well tolerated and resulted in  relief.  Needle removed and dressing placed and post injection instructions were given.  Pt was advised to call or return to clinic if these symptoms worsen or fail to improve as anticipated. Images permanently stored.    Pertinent previous records reviewed include venous ultrasound 8/24's 25, venous ultrasound 10/12/2024, ER note 07/01/2024, oncology note 10/16/24   Follow Up: As needed if no improvement or worsening of symptoms.  Could consider repeat aspiration versus treatment for knee osteoarthritis versus referral for surgical Baker's cyst excision   Subjective:   I, Salihah Peckham, am serving as a neurosurgeon for Doctor Morene Mace  Chief Complaint: left leg pain   HPI:   10/23/2024 Patient is a 58 year old female with left leg pain. Patient states pain started 6 months ago. Pain is located to the back of the knee and down the leg . Hx of blood clot in leg. Decreased ROM. She isnt able to do her ADLS. No meds for the pain. No numbness or tingling.    Relevant Historical Information: Resolved DVT, no longer on anticoagulation.  Additional pertinent review of systems negative.  Current Medications[1]   Objective:     Vitals:   10/23/24 0845  BP: 118/82  Weight: 131 lb (59.4 kg)  Height: 5' 2 (1.575 m)      Body mass index is 23.96 kg/m.    Physical Exam:    General:  awake, alert oriented, no acute distress nontoxic Skin: no suspicious lesions or rashes Neuro:sensation intact and strength 5/5 with no deficits, no atrophy, normal muscle tone Psych: No signs of anxiety, depression or other mood disorder  Left knee: Mild crepitus No swelling No deformity Neg fluid wave, joint milking ROM Flex 110, Ext 0 TTP posterior fossa NTTP over the quad tendon, medial fem condyle, lat fem condyle, patella, patella tendon, tibial tuberostiy, fibular head,   pes anserine bursa, gerdy's tubercle,  medial jt line, lateral jt line Neg anterior and posterior drawer Neg lachman Negative varus stress Negative valgus stress Negative McMurray Negative Thessaly  Gait normal    Electronically signed by:  Odis Mace D.[facility] 9:22 AM 10/23/2024     [1]  Current Outpatient Medications:    acetaminophen  (TYLENOL ) 500 MG tablet, Take 500 mg by mouth every 6 (six) hours as needed., Disp: , Rfl:    apixaban  (ELIQUIS ) 5 MG TABS tablet, Take 2 tablets (10 mg total) by mouth 2 (two) times daily for 1 dose., Disp: 2 tablet, Rfl: 0   Azelastine HCl 137 MCG/SPRAY SOLN, Place 1 spray into the nose daily as needed., Disp: , Rfl:    Cholecalciferol (VITAMIN D3) 5000 units TABS, Take 1 tablet by mouth daily., Disp: , Rfl:    escitalopram (LEXAPRO) 10 MG tablet, Take 10 mg by mouth daily., Disp: , Rfl:    Ferrous Sulfate (IRON) 325 (65 Fe) MG TABS, Take 1 tablet by mouth daily., Disp: , Rfl:    loratadine (CLARITIN) 10 MG tablet, Take 10 mg by mouth daily as needed., Disp: , Rfl:    rosuvastatin (CRESTOR) 10 MG tablet, Take 10 mg by mouth at bedtime., Disp: , Rfl:    SYNTHROID 75 MCG tablet, Take 75 mcg by mouth daily., Disp: , Rfl:

## 2024-10-23 ENCOUNTER — Other Ambulatory Visit: Payer: Self-pay

## 2024-10-23 ENCOUNTER — Ambulatory Visit: Admitting: Sports Medicine

## 2024-10-23 VITALS — BP 118/82 | Ht 62.0 in | Wt 131.0 lb

## 2024-10-23 DIAGNOSIS — M1712 Unilateral primary osteoarthritis, left knee: Secondary | ICD-10-CM

## 2024-10-23 DIAGNOSIS — M7122 Synovial cyst of popliteal space [Baker], left knee: Secondary | ICD-10-CM | POA: Diagnosis not present

## 2024-10-23 NOTE — Patient Instructions (Signed)
 Activity as tolerated   Tylenol  as needed   As needed follow up

## 2025-01-07 ENCOUNTER — Inpatient Hospital Stay

## 2025-01-14 ENCOUNTER — Inpatient Hospital Stay: Admitting: Hematology and Oncology

## 2025-01-14 ENCOUNTER — Inpatient Hospital Stay
